# Patient Record
Sex: Male | Born: 1950 | Race: White | Hispanic: No | Marital: Married | Smoking: Never smoker
Health system: Southern US, Community
[De-identification: ages and names within clinical notes are randomized; demographics above are authoritative.]

## PROBLEM LIST (undated history)

## (undated) DIAGNOSIS — E669 Obesity, unspecified: Secondary | ICD-10-CM

## (undated) DIAGNOSIS — T884XXA Failed or difficult intubation, initial encounter: Secondary | ICD-10-CM

## (undated) DIAGNOSIS — I10 Essential (primary) hypertension: Secondary | ICD-10-CM

## (undated) DIAGNOSIS — Z8719 Personal history of other diseases of the digestive system: Secondary | ICD-10-CM

## (undated) DIAGNOSIS — E785 Hyperlipidemia, unspecified: Secondary | ICD-10-CM

## (undated) DIAGNOSIS — K4090 Unilateral inguinal hernia, without obstruction or gangrene, not specified as recurrent: Secondary | ICD-10-CM

## (undated) DIAGNOSIS — E119 Type 2 diabetes mellitus without complications: Secondary | ICD-10-CM

## (undated) DIAGNOSIS — G4730 Sleep apnea, unspecified: Secondary | ICD-10-CM

## (undated) DIAGNOSIS — N529 Male erectile dysfunction, unspecified: Secondary | ICD-10-CM

## (undated) HISTORY — DX: Unilateral inguinal hernia, without obstruction or gangrene, not specified as recurrent: K40.90

## (undated) HISTORY — DX: Essential (primary) hypertension: I10

## (undated) HISTORY — DX: Hyperlipidemia, unspecified: E78.5

## (undated) HISTORY — DX: Sleep apnea, unspecified: G47.30

## (undated) HISTORY — DX: Male erectile dysfunction, unspecified: N52.9

## (undated) HISTORY — DX: Obesity, unspecified: E66.9

## (undated) HISTORY — PX: INGUINAL HERNIA REPAIR: SUR1180

## (undated) HISTORY — DX: Personal history of other diseases of the digestive system: Z87.19

## (undated) HISTORY — PX: COLONOSCOPY: SHX174

## (undated) HISTORY — DX: Type 2 diabetes mellitus without complications: E11.9

---

## 2006-08-08 ENCOUNTER — Ambulatory Visit: Payer: Self-pay | Admitting: Gastroenterology

## 2012-01-16 ENCOUNTER — Ambulatory Visit: Payer: Self-pay | Admitting: Gastroenterology

## 2012-04-14 ENCOUNTER — Emergency Department: Payer: Self-pay | Admitting: Emergency Medicine

## 2012-04-14 LAB — URINALYSIS, COMPLETE
Bacteria: NONE SEEN
Bilirubin,UR: NEGATIVE
Glucose,UR: NEGATIVE mg/dL (ref 0–75)
Leukocyte Esterase: NEGATIVE
RBC,UR: 2 /HPF (ref 0–5)
Specific Gravity: 1.023 (ref 1.003–1.030)
Squamous Epithelial: <1
WBC UR: 2 /HPF (ref 0–5)

## 2012-04-14 LAB — COMPREHENSIVE METABOLIC PANEL
Alkaline Phosphatase: 100 U/L (ref 50–136)
Anion Gap: 7 (ref 7–16)
BUN: 16 mg/dL (ref 7–18)
Calcium, Total: 9.6 mg/dL (ref 8.5–10.1)
Chloride: 101 mmol/L (ref 98–107)
Co2: 28 mmol/L (ref 21–32)
Creatinine: 1.13 mg/dL (ref 0.60–1.30)
EGFR (African American): >60
EGFR (Non-African Amer.): >60
Glucose: 226 mg/dL — ABNORMAL HIGH (ref 65–99)
Potassium: 3.7 mmol/L (ref 3.5–5.1)
SGPT (ALT): 21 U/L (ref 12–78)

## 2012-04-14 LAB — CBC
HCT: 41.3 % (ref 40.0–52.0)
MCV: 88 fL (ref 80–100)
RBC: 4.67 10*6/uL (ref 4.40–5.90)
RDW: 13.7 % (ref 11.5–14.5)
WBC: 9.3 10*3/uL (ref 3.8–10.6)

## 2014-12-19 ENCOUNTER — Other Ambulatory Visit: Payer: Self-pay | Admitting: *Deleted

## 2014-12-19 ENCOUNTER — Encounter: Payer: Self-pay | Admitting: Surgery

## 2014-12-19 ENCOUNTER — Ambulatory Visit
Admission: RE | Admit: 2014-12-19 | Discharge: 2014-12-19 | Disposition: A | Discharge status: Home / Self Care | Payer: BLUE CROSS/BLUE SHIELD | Source: Ambulatory Visit | Attending: Surgery | Admitting: Surgery

## 2014-12-19 ENCOUNTER — Encounter: Payer: Self-pay | Admitting: *Deleted

## 2014-12-19 ENCOUNTER — Ambulatory Visit (INDEPENDENT_AMBULATORY_CARE_PROVIDER_SITE_OTHER): Payer: BLUE CROSS/BLUE SHIELD | Admitting: Surgery

## 2014-12-19 ENCOUNTER — Other Ambulatory Visit
Admission: RE | Admit: 2014-12-19 | Discharge: 2014-12-19 | Disposition: A | Discharge status: Home / Self Care | Payer: BLUE CROSS/BLUE SHIELD | Source: Ambulatory Visit | Attending: Surgery | Admitting: Surgery

## 2014-12-19 VITALS — BP 146/82 | HR 100 | Temp 99.2°F | Ht 70.0 in | Wt 239.0 lb

## 2014-12-19 DIAGNOSIS — R1084 Generalized abdominal pain: Secondary | ICD-10-CM | POA: Diagnosis present

## 2014-12-19 DIAGNOSIS — R1010 Upper abdominal pain, unspecified: Secondary | ICD-10-CM | POA: Diagnosis not present

## 2014-12-19 DIAGNOSIS — K5730 Diverticulosis of large intestine without perforation or abscess without bleeding: Secondary | ICD-10-CM | POA: Diagnosis not present

## 2014-12-19 DIAGNOSIS — E785 Hyperlipidemia, unspecified: Secondary | ICD-10-CM | POA: Insufficient documentation

## 2014-12-19 DIAGNOSIS — R1032 Left lower quadrant pain: Secondary | ICD-10-CM | POA: Diagnosis not present

## 2014-12-19 DIAGNOSIS — I10 Essential (primary) hypertension: Secondary | ICD-10-CM | POA: Diagnosis not present

## 2014-12-19 DIAGNOSIS — G8929 Other chronic pain: Secondary | ICD-10-CM | POA: Diagnosis not present

## 2014-12-19 DIAGNOSIS — K8020 Calculus of gallbladder without cholecystitis without obstruction: Secondary | ICD-10-CM | POA: Diagnosis not present

## 2014-12-19 DIAGNOSIS — E119 Type 2 diabetes mellitus without complications: Secondary | ICD-10-CM | POA: Insufficient documentation

## 2014-12-19 DIAGNOSIS — R1011 Right upper quadrant pain: Secondary | ICD-10-CM

## 2014-12-19 DIAGNOSIS — K8010 Calculus of gallbladder with chronic cholecystitis without obstruction: Secondary | ICD-10-CM

## 2014-12-19 LAB — CBC WITH DIFFERENTIAL/PLATELET
BASOS PCT: 0 %
Basophils Absolute: 0 10*3/uL (ref 0–0.1)
EOS ABS: 0.1 10*3/uL (ref 0–0.7)
EOS PCT: 1 %
HEMATOCRIT: 39.6 % — AB (ref 40.0–52.0)
Hemoglobin: 13.4 g/dL (ref 13.0–18.0)
Lymphocytes Relative: 8 %
Lymphs Abs: 1 10*3/uL (ref 1.0–3.6)
MCH: 29.7 pg (ref 26.0–34.0)
MCHC: 33.9 g/dL (ref 32.0–36.0)
MCV: 87.6 fL (ref 80.0–100.0)
MONO ABS: 1.1 10*3/uL — AB (ref 0.2–1.0)
MONOS PCT: 9 %
Neutro Abs: 9.5 10*3/uL — ABNORMAL HIGH (ref 1.4–6.5)
Neutrophils Relative %: 82 %
PLATELETS: 203 10*3/uL (ref 150–440)
RBC: 4.52 MIL/uL (ref 4.40–5.90)
RDW: 13.4 % (ref 11.5–14.5)
WBC: 11.6 10*3/uL — ABNORMAL HIGH (ref 3.8–10.6)

## 2014-12-19 LAB — COMPREHENSIVE METABOLIC PANEL
ALBUMIN: 3.5 g/dL (ref 3.5–5.0)
ALT: 23 U/L (ref 17–63)
ANION GAP: 11 (ref 5–15)
AST: 19 U/L (ref 15–41)
Alkaline Phosphatase: 83 U/L (ref 38–126)
BILIRUBIN TOTAL: 1.1 mg/dL (ref 0.3–1.2)
BUN: 18 mg/dL (ref 6–20)
CALCIUM: 9.1 mg/dL (ref 8.9–10.3)
CHLORIDE: 99 mmol/L — AB (ref 101–111)
CO2: 27 mmol/L (ref 22–32)
CREATININE: 1.34 mg/dL — AB (ref 0.61–1.24)
GFR calc Af Amer: >60 mL/min (ref >60)
GFR calc non Af Amer: 54 mL/min — ABNORMAL LOW (ref >60)
Glucose, Bld: 145 mg/dL — ABNORMAL HIGH (ref 65–99)
POTASSIUM: 3.8 mmol/L (ref 3.5–5.1)
SODIUM: 137 mmol/L (ref 135–145)
TOTAL PROTEIN: 7.1 g/dL (ref 6.5–8.1)

## 2014-12-19 MED ORDER — SULFAMETHOXAZOLE-TRIMETHOPRIM 400-80 MG PO TABS: 1.0000 | ORAL_TABLET | Freq: Two times a day (BID) | ORAL | Status: DC

## 2014-12-19 MED ORDER — METRONIDAZOLE 500 MG PO TABS: 500.0000 mg | ORAL_TABLET | Freq: Three times a day (TID) | ORAL | Status: DC

## 2014-12-19 MED ORDER — IOHEXOL 300 MG/ML  SOLN
100.0000 mL | Freq: Once | INTRAMUSCULAR | Status: AC | PRN
  Administered 2014-12-19: 100 mL via INTRAVENOUS

## 2014-12-19 NOTE — Progress Notes (Signed)
Surgical Consultation  12/19/2014  Alex Branch is an 64 y.o. male.   CC: Epigastric and right upper quadrant pain  HPI: This patient with known diabetes and gallstones who was seen by Dr. Michela PitcherEly several years ago and cholecystectomy was suggested but the patient did not want to have it done. Over the last week he's had recurrent epigastric right upper quadrant pain associated fatty food intolerance is also had some dark urine but no acholic stools. He has also had fevers and night sweats and chills and had a low-grade temperature in the office today.  Past Medical History  Diagnosis Date  . Sleep apnea   . Obesity   . Hypertension   . Hyperlipidemia   . History of IBS   . Erectile dysfunction   . Inguinal hernia   . Diabetes mellitus without complication [facility](HCC)     Past Surgical History  Procedure Laterality Date  . Inguinal hernia repair Right   . Colonoscopy      Family History  Problem Relation Age of Onset  . Lung cancer Father   . Diabetes type II Maternal Grandmother     Social History:  reports that he has never smoked. He has never used smokeless tobacco. He reports that he does not drink alcohol or use illicit drugs.  Allergies:  Allergies  Allergen Reactions  . Penicillin V Potassium Rash    Medications reviewed.   Review of Systems:   Review of Systems  Constitutional: Positive for fever, chills and diaphoresis. Negative for weight loss.  HENT: Negative.   Eyes: Negative.   Respiratory: Negative.   Cardiovascular: Negative.   Gastrointestinal: Positive for nausea and abdominal pain. Negative for heartburn, vomiting, diarrhea, constipation, blood in stool and melena.       Has right upper quadrant epigastric pain as well as left lower quadrant pain  Genitourinary: Negative for dysuria, urgency and frequency.  Musculoskeletal: Negative.   Skin: Negative.   Neurological: Negative.  Negative for weakness.  Endo/Heme/Allergies: Negative.    Psychiatric/Behavioral: Negative.      Physical Exam:  BP 146/82 mmHg  Pulse 100  Temp(Src) 99.2 F (37.3 C) (Oral)  Ht 5\' 10"  (1.778 m)  Wt 239 lb (108.41 kg)  BMI 34.29 kg/m2  Physical Exam  Constitutional: He is oriented to person, place, and time and well-developed, well-nourished, and in no distress. No distress.  HENT:  Head: Normocephalic and atraumatic.  Eyes: Pupils are equal, round, and reactive to light. No scleral icterus.  Neck: Normal range of motion. Neck supple.  Cardiovascular: Normal rate, regular rhythm and normal heart sounds.   Pulmonary/Chest: Effort normal and breath sounds normal. No respiratory distress. He has no wheezes. He has no rales.  Abdominal: Soft. He exhibits no distension. There is tenderness. There is no rebound and no guarding.  Minimal right upper quadrant tenderness without a Murphy's sign. Considerable tenderness in the left lower quadrant out peritoneal signs  Musculoskeletal: Normal range of motion. He exhibits no edema.  Lymphadenopathy:    He has no cervical adenopathy.  Neurological: He is alert and oriented to person, place, and time.  Skin: Skin is warm and dry.  Psychiatric: Mood, affect and judgment normal.      No results found for this or any previous visit (from the past 48 hour(s)). No results found.  Assessment/Plan:  This patient with known gallstones who had surgery recommended several years ago he's had no attacks between that time and now the last week he started  having some acid right upper quadrant pain he's also had some fevers chills night sweats and now has some left lower quadrant pain and tenderness. My concern is that this may be simple gallstone disease however he has some urinary changes as well suggestive of possible choledocholithiasis with out obvious icterus or jaundice. We will check labs today but I would also like to proceed with a CT scan of his abdomen and pelvis to ensure that this is not  diverticulitis he has significant left lower quadrant pain and tenderness and this may be something other than simple gallbladder disease I will see him back after the CT scan is performed  Lattie Haw, MD, FACS

## 2014-12-19 NOTE — Patient Instructions (Signed)
Go to the lab and have a CBC and CMP drawn today. Then [facility]  for a stat CT scan at radiology. Follow up in our office in the AM to review results.

## 2014-12-20 ENCOUNTER — Ambulatory Visit (INDEPENDENT_AMBULATORY_CARE_PROVIDER_SITE_OTHER): Payer: BLUE CROSS/BLUE SHIELD | Admitting: Surgery

## 2014-12-20 ENCOUNTER — Encounter: Payer: Self-pay | Admitting: Surgery

## 2014-12-20 VITALS — BP 143/74 | HR 89 | Temp 98.4°F

## 2014-12-20 DIAGNOSIS — R1032 Left lower quadrant pain: Secondary | ICD-10-CM

## 2014-12-20 DIAGNOSIS — K8010 Calculus of gallbladder with chronic cholecystitis without obstruction: Secondary | ICD-10-CM | POA: Diagnosis not present

## 2014-12-20 NOTE — Progress Notes (Signed)
Outpatient Surgical Follow Up  12/20/2014  Alex Branch is an 64 y.o. male.   CC: Abdominal pain  HPI: Patient was here yesterday for abdominal pain both in the right upper quadrant and the left lower quadrant. He states he feels better now he has not had a fever since yesterday and has started his antibiotics were given to him yesterday. Nausea or vomiting and actually ate a regular diet last night and felt well afterwards he has no abdominal pain today. His urine is not is dark as it was yesterday and he has had a normal bowel movement.  Past Medical History  Diagnosis Date  . Sleep apnea   . Obesity   . Hypertension   . Hyperlipidemia   . History of IBS   . Erectile dysfunction   . Inguinal hernia   . Diabetes mellitus without complication [facility])     Past Surgical History  Procedure Laterality Date  . Inguinal hernia repair Right   . Colonoscopy      Family History  Problem Relation Age of Onset  . Lung cancer Father   . Diabetes type II Maternal Grandmother     Social History:  reports that he has never smoked. He has never used smokeless tobacco. He reports that he does not drink alcohol or use illicit drugs.  Allergies:  Allergies  Allergen Reactions  . Penicillin V Potassium Rash    Medications reviewed.   Review of Systems:   Review of Systems  Constitutional: Negative for fever, chills and diaphoresis.  HENT: Negative.   Eyes: Negative.   Respiratory: Negative.   Cardiovascular: Negative.   Gastrointestinal: Positive for abdominal pain. Negative for nausea, vomiting, diarrhea, constipation and blood in stool.  Genitourinary: Negative.   Musculoskeletal: Negative.   Skin: Negative.   Neurological: Negative.   Endo/Heme/Allergies: Negative.   Psychiatric/Behavioral: Negative.      Physical Exam:  BP 143/74 mmHg  Pulse 89  Temp(Src) 98.4 F (36.9 C) (Oral)  Physical Exam  Constitutional: He is oriented to person, place, and time and  well-developed, well-nourished, and in no distress. No distress.  HENT:  Head: Normocephalic and atraumatic.  Eyes: Pupils are equal, round, and reactive to light. No scleral icterus.  Neck: Normal range of motion.  Cardiovascular: Normal rate and regular rhythm.   Pulmonary/Chest: Effort normal. No respiratory distress.  Abdominal: Soft. He exhibits no distension. There is tenderness. There is no rebound and no guarding.  Minimal and improved right upper quadrant and left lower quadrant tenderness without peritoneal signs or Murphy sign  Musculoskeletal: Normal range of motion. He exhibits no edema.  Lymphadenopathy:    He has no cervical adenopathy.  Neurological: He is alert and oriented to person, place, and time.  Skin: Skin is warm and dry.  Psychiatric: Mood, affect and judgment normal.      Results for orders placed or performed during the hospital encounter of 12/19/14 (from the past 48 hour(s))  CBC with Differential/Platelet     Status: Abnormal   Collection Time: 12/19/14 12:08 PM  Result Value Ref Range   WBC 11.6 (H) 3.8 - 10.6 K/uL   RBC 4.52 4.40 - 5.90 MIL/uL   Hemoglobin 13.4 13.0 - 18.0 g/dL   HCT 39.6 (L) 40.0 - 52.0 %   MCV 87.6 80.0 - 100.0 fL   MCH 29.7 26.0 - 34.0 pg   MCHC 33.9 32.0 - 36.0 g/dL   RDW 13.4 11.5 - 14.5 %   Platelets 203 150 -  440 K/uL   Neutrophils Relative % 82 %   Neutro Abs 9.5 (H) 1.4 - 6.5 K/uL   Lymphocytes Relative 8 %   Lymphs Abs 1.0 1.0 - 3.6 K/uL   Monocytes Relative 9 %   Monocytes Absolute 1.1 (H) 0.2 - 1.0 K/uL   Eosinophils Relative 1 %   Eosinophils Absolute 0.1 0 - 0.7 K/uL   Basophils Relative 0 %   Basophils Absolute 0.0 0 - 0.1 K/uL  Comprehensive metabolic panel     Status: Abnormal   Collection Time: 12/19/14 12:08 PM  Result Value Ref Range   Sodium 137 135 - 145 mmol/L   Potassium 3.8 3.5 - 5.1 mmol/L   Chloride 99 (L) 101 - 111 mmol/L   CO2 27 22 - 32 mmol/L   Glucose, Bld 145 (H) 65 - 99 mg/dL   BUN 18  6 - 20 mg/dL   Creatinine, Ser 1.34 (H) 0.61 - 1.24 mg/dL   Calcium 9.1 8.9 - 10.3 mg/dL   Total Protein 7.1 6.5 - 8.1 g/dL   Albumin 3.5 3.5 - 5.0 g/dL   AST 19 15 - 41 U/L   ALT 23 17 - 63 U/L   Alkaline Phosphatase 83 38 - 126 U/L   Total Bilirubin 1.1 0.3 - 1.2 mg/dL   GFR calc non Af Amer 54 (L) >60 mL/min   GFR calc Af Amer >60 >60 mL/min    Comment: (NOTE) The eGFR has been calculated using the CKD EPI equation. This calculation has not been validated in all clinical situations. eGFR's persistently <60 mL/min signify possible Chronic Kidney Disease.    Anion gap 11 5 - 15   Ct Abdomen Pelvis W Contrast  12/19/2014  CLINICAL DATA:  RIGHT upper quadrant mid abdominal pain for several months, worsening, patient states "his gallbladder is acting up causing pain". History hypertension, hyperlipidemia, irritable bowel syndrome, diabetes mellitus EXAM: CT ABDOMEN AND PELVIS WITH CONTRAST TECHNIQUE: Multidetector CT imaging of the abdomen and pelvis was performed using the standard protocol following bolus administration of intravenous contrast. Sagittal and coronal MPR images reconstructed from axial data set. CONTRAST:  149m OMNIPAQUE IOHEXOL 300 MG/ML SOLN IV. Dilute oral contrast. COMPARISON:  None ; correlation limited abdominal ultrasound 04/14/2012 FINDINGS: Lung bases clear. Calcified gallstones dependently in gallbladder with mild gallbladder wall thickening. Minimal pericholecystic inflammatory changes. No biliary dilatation. Findings concerning for acute cholecystitis. Liver, spleen, pancreas, kidneys, and adrenal glands normal. Normal appearing ureters and decompressed bladder. Minimal distal colonic diverticulosis without evidence of diverticulitis. Stomach and bowel loops otherwise normal appearance. No mass, adenopathy, free air or free fluid. Appendix not visualized. No acute osseous findings. IMPRESSION: Cholelithiasis with mild gallbladder wall thickening and minimal  pericholecystic infiltration suspicious for acute cholecystitis. Minimal distal colonic diverticulosis. These results will be called to the ordering clinician or representative by the Radiologist Assistant, and communication documented in the PACS or zVision Dashboard. Electronically Signed   By: MLavonia DanaM.D.   On: 12/19/2014 15:06    Assessment/Plan:  CT scan is personally reviewed as are his labs. He has a mild leukocytosis. He also has some diverticulitis low in the pelvis. And the CT also is suggestive of pericolic cystic fluid and thickened gallbladder wall. With his diverticulitis as a comorbidity I have suggested continuing treatment with the oral antibiotics for now area he has improved since yesterday and since starting his antibiotics. I would then plan laparoscopic cholecystectomy with cholangiography in approximately 2 weeks. The options of this were  discussed the rationale for offering this approach was discussed and the risk of bleeding infection recurrence of symptoms failure to resolve his symptoms (procedure bile duct damage bile duct leak retained common bile duct stone any of which could require further surgery and/or ERCP stent and papillotomy were all reviewed with him and understood and agreed to proceed.  I also discussed with him the need to go to the emergency room should he have an attack at any time during the weekend that was unrelenting. Questions were answered for him he understood and agreed to proceed  Florene Glen, MD, FACS

## 2014-12-20 NOTE — Patient Instructions (Signed)
You will be receiving a phone call from our surgery scheduler detailing your pre op appointment date and time, and your surgery date and time in the next few days. If you have any questions please give our office a call at your earliest convenience.

## 2014-12-23 ENCOUNTER — Other Ambulatory Visit: Payer: BLUE CROSS/BLUE SHIELD

## 2014-12-23 ENCOUNTER — Encounter
Admission: RE | Admit: 2014-12-23 | Discharge: 2014-12-23 | Disposition: A | Discharge status: Home / Self Care | Payer: BLUE CROSS/BLUE SHIELD | Source: Ambulatory Visit | Attending: Surgery | Admitting: Surgery

## 2014-12-23 DIAGNOSIS — K8050 Calculus of bile duct without cholangitis or cholecystitis without obstruction: Secondary | ICD-10-CM | POA: Diagnosis not present

## 2014-12-23 DIAGNOSIS — Z01818 Encounter for other preprocedural examination: Secondary | ICD-10-CM | POA: Diagnosis not present

## 2014-12-23 NOTE — Patient Instructions (Signed)
  Your procedure is scheduled on: Wednesday 01/04/15 Report to Day Surgery. 2ND FLOOR MEDICAL MALL ENTRANCE To find out your arrival time please call [phone] between 1PM - 3PM on Tuesday 01/03/15.  Remember: Instructions that are not followed completely may result in serious medical risk, up to and including death, or upon the discretion of your surgeon and anesthesiologist your surgery may need to be rescheduled.    __X__ 1. Do not eat food or drink liquids after midnight. No gum chewing or hard candies.     __X__ 2. No Alcohol for 24 hours before or after surgery.   ____ 3. Bring all medications with you on the day of surgery if instructed.    __X__ 4. Notify your doctor if there is any change in your medical condition     (cold, fever, infections).     Do not wear jewelry, make-up, hairpins, clips or nail polish.  Do not wear lotions, powders, or perfumes.  Do not shave 48 hours prior to surgery. Men may shave face and neck.  Do not bring valuables to the hospital.    [facility] not [facility] any belongings or valuables.               Contacts, dentures or bridgework may not be worn into surgery.  Leave your suitcase in the car. After surgery it may be brought to your room.  For patients admitted to the hospital, discharge time is determined by your                treatment team.   Patients discharged the day of surgery will not be allowed to drive home.   Please read over the following fact sheets that you were given:   Surgical Site Infection Prevention   __X__ Take these medicines the morning of surgery with A SIP OF WATER:    1. NONE (you take your blood pressure med and cholesterol med in the evening as usual)  2.   3.   4.  5.  6.  ____ Fleet Enema (as directed)   __X__ Use CHG Soap as directed  ____ Use inhalers on the day of surgery  __X__ Stop metformin 2 days prior to surgery    __X__ Take 1/2 of usual insulin dose the night before surgery  and NONE on the morning of surgery.   __X__ Stop Coumadin/Plavix/aspirin on 7 DAYS PRIOR TO YOUR PROCEDURE  ____ Stop Anti-inflammatories on     __X__ Stop supplements until after surgery.  B12  ____ Bring C-Pap to the hospital.

## 2014-12-27 ENCOUNTER — Telehealth: Payer: Self-pay | Admitting: Surgery

## 2014-12-27 NOTE — Telephone Encounter (Signed)
Pt advised of pre op date/time and sx date. Sx: 01/04/15 with Dr Ludwig Clarksooper--Laparoscopic cholecystectomy with gram. Pre op: 12/23/14 at 11:00am--office.

## 2014-12-29 ENCOUNTER — Other Ambulatory Visit: Payer: BLUE CROSS/BLUE SHIELD

## 2015-01-04 ENCOUNTER — Ambulatory Visit: Payer: BLUE CROSS/BLUE SHIELD | Admitting: Anesthesiology

## 2015-01-04 ENCOUNTER — Observation Stay
Admission: RE | Admit: 2015-01-04 | Discharge: 2015-01-06 | Disposition: A | Discharge status: Home / Self Care | Payer: BLUE CROSS/BLUE SHIELD | Source: Ambulatory Visit | Attending: Surgery | Admitting: Surgery

## 2015-01-04 ENCOUNTER — Encounter
Admission: RE | Disposition: A | Discharge status: Home / Self Care | Payer: Self-pay | Source: Ambulatory Visit | Attending: Surgery

## 2015-01-04 DIAGNOSIS — Z79899 Other long term (current) drug therapy: Secondary | ICD-10-CM | POA: Insufficient documentation

## 2015-01-04 DIAGNOSIS — Z7982 Long term (current) use of aspirin: Secondary | ICD-10-CM | POA: Diagnosis not present

## 2015-01-04 DIAGNOSIS — E785 Hyperlipidemia, unspecified: Secondary | ICD-10-CM | POA: Diagnosis not present

## 2015-01-04 DIAGNOSIS — Z7984 Long term (current) use of oral hypoglycemic drugs: Secondary | ICD-10-CM | POA: Insufficient documentation

## 2015-01-04 DIAGNOSIS — Z880 Allergy status to penicillin: Secondary | ICD-10-CM | POA: Insufficient documentation

## 2015-01-04 DIAGNOSIS — K812 Acute cholecystitis with chronic cholecystitis: Secondary | ICD-10-CM | POA: Diagnosis present

## 2015-01-04 DIAGNOSIS — K8000 Calculus of gallbladder with acute cholecystitis without obstruction: Principal | ICD-10-CM | POA: Insufficient documentation

## 2015-01-04 DIAGNOSIS — I10 Essential (primary) hypertension: Secondary | ICD-10-CM | POA: Diagnosis not present

## 2015-01-04 DIAGNOSIS — Z794 Long term (current) use of insulin: Secondary | ICD-10-CM | POA: Diagnosis not present

## 2015-01-04 DIAGNOSIS — Z833 Family history of diabetes mellitus: Secondary | ICD-10-CM | POA: Insufficient documentation

## 2015-01-04 DIAGNOSIS — E669 Obesity, unspecified: Secondary | ICD-10-CM | POA: Diagnosis not present

## 2015-01-04 DIAGNOSIS — E119 Type 2 diabetes mellitus without complications: Secondary | ICD-10-CM | POA: Diagnosis not present

## 2015-01-04 DIAGNOSIS — K8010 Calculus of gallbladder with chronic cholecystitis without obstruction: Secondary | ICD-10-CM | POA: Diagnosis present

## 2015-01-04 DIAGNOSIS — K589 Irritable bowel syndrome without diarrhea: Secondary | ICD-10-CM | POA: Diagnosis not present

## 2015-01-04 DIAGNOSIS — Z801 Family history of malignant neoplasm of trachea, bronchus and lung: Secondary | ICD-10-CM | POA: Diagnosis not present

## 2015-01-04 DIAGNOSIS — G4730 Sleep apnea, unspecified: Secondary | ICD-10-CM | POA: Diagnosis not present

## 2015-01-04 HISTORY — DX: Failed or difficult intubation, initial encounter: T88.4XXA

## 2015-01-04 HISTORY — PX: CHOLECYSTECTOMY: SHX55

## 2015-01-04 LAB — CREATININE, SERUM
Creatinine, Ser: 0.98 mg/dL (ref 0.61–1.24)
GFR calc non Af Amer: >60 mL/min (ref >60)

## 2015-01-04 LAB — CBC
HEMATOCRIT: 39.7 % — AB (ref 40.0–52.0)
Hemoglobin: 13.2 g/dL (ref 13.0–18.0)
MCH: 29.4 pg (ref 26.0–34.0)
MCHC: 33.2 g/dL (ref 32.0–36.0)
MCV: 88.5 fL (ref 80.0–100.0)
PLATELETS: 210 10*3/uL (ref 150–440)
RBC: 4.49 MIL/uL (ref 4.40–5.90)
RDW: 13.8 % (ref 11.5–14.5)
WBC: 14.4 10*3/uL — ABNORMAL HIGH (ref 3.8–10.6)

## 2015-01-04 LAB — GLUCOSE, CAPILLARY
GLUCOSE-CAPILLARY: 242 mg/dL — AB (ref 65–99)
GLUCOSE-CAPILLARY: 216 mg/dL — AB (ref 65–99)
GLUCOSE-CAPILLARY: 270 mg/dL — AB (ref 65–99)
Glucose-Capillary: 208 mg/dL — ABNORMAL HIGH (ref 65–99)
Glucose-Capillary: 244 mg/dL — ABNORMAL HIGH (ref 65–99)

## 2015-01-04 SURGERY — LAPAROSCOPIC CHOLECYSTECTOMY WITH INTRAOPERATIVE CHOLANGIOGRAM
Anesthesia: General

## 2015-01-04 MED ORDER — HEPARIN SODIUM (PORCINE) 5000 UNIT/ML IJ SOLN
5000.0000 [IU] | Freq: Three times a day (TID) | INTRAMUSCULAR | Status: DC
  Administered 2015-01-04 – 2015-01-06 (×5): 5000 [IU] via SUBCUTANEOUS
  Filled 2015-01-04 (×4): qty 1

## 2015-01-04 MED ORDER — INSULIN GLARGINE 100 UNIT/ML ~~LOC~~ SOLN
48.0000 [IU] | Freq: Every day | SUBCUTANEOUS | Status: DC
  Administered 2015-01-05 – 2015-01-06 (×2): 48 [IU] via SUBCUTANEOUS
  Filled 2015-01-04 (×2): qty 0.48

## 2015-01-04 MED ORDER — HEPARIN SODIUM (PORCINE) 5000 UNIT/ML IJ SOLN
INTRAMUSCULAR | Status: AC
  Administered 2015-01-04: 5000 [IU] via SUBCUTANEOUS
  Filled 2015-01-04: qty 1

## 2015-01-04 MED ORDER — FENTANYL CITRATE (PF) 100 MCG/2ML IJ SOLN
INTRAMUSCULAR | Status: AC
  Administered 2015-01-04: 25 ug via INTRAVENOUS
  Filled 2015-01-04: qty 2

## 2015-01-04 MED ORDER — ROCURONIUM BROMIDE 100 MG/10ML IV SOLN
INTRAVENOUS | Status: DC | PRN
  Administered 2015-01-04: 30 mg via INTRAVENOUS
  Administered 2015-01-04: 20 mg via INTRAVENOUS

## 2015-01-04 MED ORDER — CYANOCOBALAMIN 500 MCG PO TABS
1000.0000 ug | ORAL_TABLET | Freq: Every day | ORAL | Status: DC
  Administered 2015-01-05 – 2015-01-06 (×2): 1000 ug via ORAL
  Filled 2015-01-04 (×3): qty 2

## 2015-01-04 MED ORDER — INSULIN ASPART 100 UNIT/ML FLEXPEN: 4.0000 [IU] | PEN_INJECTOR | Freq: Two times a day (BID) | SUBCUTANEOUS | Status: DC

## 2015-01-04 MED ORDER — PROPOFOL 10 MG/ML IV BOLUS
INTRAVENOUS | Status: DC | PRN
  Administered 2015-01-04: 200 mg via INTRAVENOUS

## 2015-01-04 MED ORDER — LIDOCAINE HCL (CARDIAC) 20 MG/ML IV SOLN
INTRAVENOUS | Status: DC | PRN
  Administered 2015-01-04: 100 mg via INTRAVENOUS

## 2015-01-04 MED ORDER — CIPROFLOXACIN IN D5W 400 MG/200ML IV SOLN
INTRAVENOUS | Status: AC
  Administered 2015-01-04: 400 mg via INTRAVENOUS
  Filled 2015-01-04: qty 200

## 2015-01-04 MED ORDER — LISINOPRIL 10 MG PO TABS
10.0000 mg | ORAL_TABLET | Freq: Every day | ORAL | Status: DC
  Administered 2015-01-04: 10 mg via ORAL
  Filled 2015-01-04 (×3): qty 1

## 2015-01-04 MED ORDER — OXYCODONE HCL 5 MG/5ML PO SOLN: 5.0000 mg | Freq: Once | ORAL | Status: DC | PRN

## 2015-01-04 MED ORDER — OXYCODONE HCL 5 MG PO TABS: 5.0000 mg | ORAL_TABLET | Freq: Once | ORAL | Status: DC | PRN

## 2015-01-04 MED ORDER — SIMVASTATIN 40 MG PO TABS
40.0000 mg | ORAL_TABLET | Freq: Every evening | ORAL | Status: DC
  Administered 2015-01-04 – 2015-01-05 (×2): 40 mg via ORAL
  Filled 2015-01-04 (×2): qty 1

## 2015-01-04 MED ORDER — FAMOTIDINE 20 MG PO TABS
20.0000 mg | ORAL_TABLET | Freq: Once | ORAL | Status: AC
  Administered 2015-01-04: 20 mg via ORAL

## 2015-01-04 MED ORDER — BUPIVACAINE-EPINEPHRINE (PF) 0.25% -1:200000 IJ SOLN
INTRAMUSCULAR | Status: DC | PRN
  Administered 2015-01-04: 30 mL via PERINEURAL

## 2015-01-04 MED ORDER — METFORMIN HCL 500 MG PO TABS
500.0000 mg | ORAL_TABLET | Freq: Two times a day (BID) | ORAL | Status: DC
  Administered 2015-01-04 – 2015-01-06 (×4): 500 mg via ORAL
  Filled 2015-01-04 (×4): qty 1

## 2015-01-04 MED ORDER — MORPHINE SULFATE (PF) 2 MG/ML IV SOLN
2.0000 mg | INTRAVENOUS | Status: DC | PRN
  Administered 2015-01-04: 2 mg via INTRAVENOUS
  Filled 2015-01-04: qty 1

## 2015-01-04 MED ORDER — ONDANSETRON HCL 4 MG/2ML IJ SOLN
INTRAMUSCULAR | Status: DC | PRN
  Administered 2015-01-04: 4 mg via INTRAVENOUS

## 2015-01-04 MED ORDER — HEPARIN SODIUM (PORCINE) 5000 UNIT/ML IJ SOLN
5000.0000 [IU] | Freq: Once | INTRAMUSCULAR | Status: AC
  Administered 2015-01-04: 5000 [IU] via SUBCUTANEOUS

## 2015-01-04 MED ORDER — FENTANYL CITRATE (PF) 100 MCG/2ML IJ SOLN
INTRAMUSCULAR | Status: AC
  Administered 2015-01-04: 50 ug via INTRAVENOUS
  Filled 2015-01-04: qty 2

## 2015-01-04 MED ORDER — PIOGLITAZONE HCL 45 MG PO TABS
45.0000 mg | ORAL_TABLET | Freq: Every day | ORAL | Status: DC
  Administered 2015-01-05 – 2015-01-06 (×2): 45 mg via ORAL
  Filled 2015-01-04 (×3): qty 1

## 2015-01-04 MED ORDER — OXYCODONE HCL 5 MG PO TABS
5.0000 mg | ORAL_TABLET | ORAL | Status: DC | PRN
  Administered 2015-01-04 – 2015-01-05 (×4): 5 mg via ORAL
  Filled 2015-01-04 (×4): qty 1

## 2015-01-04 MED ORDER — CIPROFLOXACIN IN D5W 400 MG/200ML IV SOLN
400.0000 mg | INTRAVENOUS | Status: AC
  Administered 2015-01-04 (×2): 400 mg via INTRAVENOUS

## 2015-01-04 MED ORDER — FENTANYL CITRATE (PF) 100 MCG/2ML IJ SOLN
25.0000 ug | INTRAMUSCULAR | Status: DC | PRN
  Administered 2015-01-04: 50 ug via INTRAVENOUS
  Administered 2015-01-04 (×2): 25 ug via INTRAVENOUS
  Administered 2015-01-04: 50 ug via INTRAVENOUS

## 2015-01-04 MED ORDER — ADULT MULTIVITAMIN W/MINERALS CH
1.0000 | ORAL_TABLET | Freq: Every day | ORAL | Status: DC
Start: 2015-01-04 — End: 2015-01-06
  Administered 2015-01-04 – 2015-01-06 (×3): 1 via ORAL
  Filled 2015-01-04 (×3): qty 1

## 2015-01-04 MED ORDER — BUPIVACAINE-EPINEPHRINE (PF) 0.25% -1:200000 IJ SOLN
INTRAMUSCULAR | Status: AC
  Filled 2015-01-04: qty 30

## 2015-01-04 MED ORDER — SUGAMMADEX SODIUM 200 MG/2ML IV SOLN
INTRAVENOUS | Status: DC | PRN
  Administered 2015-01-04: 200 mg via INTRAVENOUS

## 2015-01-04 MED ORDER — INSULIN ASPART 100 UNIT/ML ~~LOC~~ SOLN
4.0000 [IU] | Freq: Two times a day (BID) | SUBCUTANEOUS | Status: DC
  Administered 2015-01-04 – 2015-01-06 (×4): 4 [IU] via SUBCUTANEOUS
  Filled 2015-01-04 (×4): qty 4

## 2015-01-04 MED ORDER — SODIUM CHLORIDE 0.9 % IV SOLN
INTRAVENOUS | Status: DC
  Administered 2015-01-04 (×2): via INTRAVENOUS

## 2015-01-04 MED ORDER — ASPIRIN EC 81 MG PO TBEC
81.0000 mg | DELAYED_RELEASE_TABLET | Freq: Every day | ORAL | Status: DC
  Administered 2015-01-04 – 2015-01-06 (×3): 81 mg via ORAL
  Filled 2015-01-04 (×3): qty 1

## 2015-01-04 MED ORDER — FENTANYL CITRATE (PF) 100 MCG/2ML IJ SOLN
INTRAMUSCULAR | Status: DC | PRN
  Administered 2015-01-04 (×2): 50 ug via INTRAVENOUS

## 2015-01-04 MED ORDER — FAMOTIDINE 20 MG PO TABS
ORAL_TABLET | ORAL | Status: AC
  Administered 2015-01-04: 09:00:00
  Filled 2015-01-04: qty 1

## 2015-01-04 MED ORDER — MIDAZOLAM HCL 2 MG/2ML IJ SOLN
INTRAMUSCULAR | Status: DC | PRN
  Administered 2015-01-04: 2 mg via INTRAVENOUS

## 2015-01-04 SURGICAL SUPPLY — 42 items
ADHESIVE MASTISOL STRL (MISCELLANEOUS) ×3 IMPLANT
APPLIER CLIP ROT 10 11.4 M/L (STAPLE) ×3
BLADE SURG SZ11 CARB STEEL (BLADE) ×3 IMPLANT
CANISTER SUCT 1200ML W/VALVE (MISCELLANEOUS) ×3 IMPLANT
CATH CHOLANGI 4FR 420404F (CATHETERS) ×3 IMPLANT
CHLORAPREP W/TINT 26ML (MISCELLANEOUS) ×3 IMPLANT
CLIP APPLIE ROT 10 11.4 M/L (STAPLE) ×1 IMPLANT
CLOSURE WOUND 1/2 X4 (GAUZE/BANDAGES/DRESSINGS) ×1
CONRAY 60ML FOR OR (MISCELLANEOUS) IMPLANT
DRAPE C-ARM XRAY 36X54 (DRAPES) ×3 IMPLANT
ENDOPOUCH RETRIEVER 10 (MISCELLANEOUS) ×3 IMPLANT
GAUZE SPONGE NON-WVN 2X2 STRL (MISCELLANEOUS) ×4 IMPLANT
GLOVE BIO SURGEON STRL SZ8 (GLOVE) ×3 IMPLANT
GOWN STRL REUS W/ TWL LRG LVL3 (GOWN DISPOSABLE) ×4 IMPLANT
GOWN STRL REUS W/TWL LRG LVL3 (GOWN DISPOSABLE) ×8
IRRIGATION STRYKERFLOW (MISCELLANEOUS) ×1 IMPLANT
IRRIGATOR STRYKERFLOW (MISCELLANEOUS) ×3
IV CATH ANGIO 12GX3 LT BLUE (NEEDLE) ×3 IMPLANT
IV NS 1000ML (IV SOLUTION) ×2
IV NS 1000ML BAXH (IV SOLUTION) ×1 IMPLANT
JACKSON PRATT 10 (INSTRUMENTS) ×3 IMPLANT
KIT RM TURNOVER STRD PROC AR (KITS) ×3 IMPLANT
LABEL OR SOLS (LABEL) IMPLANT
NDL SAFETY 22GX1.5 (NEEDLE) ×3 IMPLANT
NEEDLE VERESS 14GA 120MM (NEEDLE) ×3 IMPLANT
NS IRRIG 500ML POUR BTL (IV SOLUTION) ×3 IMPLANT
PACK LAP CHOLECYSTECTOMY (MISCELLANEOUS) ×3 IMPLANT
PAD GROUND ADULT SPLIT (MISCELLANEOUS) ×3 IMPLANT
SCISSORS METZENBAUM CVD 33 (INSTRUMENTS) ×3 IMPLANT
SLEEVE ENDOPATH XCEL 5M (ENDOMECHANICALS) ×3 IMPLANT
SPONGE EXCIL AMD DRAIN 4X4 6P (MISCELLANEOUS) ×3 IMPLANT
SPONGE LAP 18X18 5 PK (GAUZE/BANDAGES/DRESSINGS) IMPLANT
SPONGE VERSALON 2X2 STRL (MISCELLANEOUS) ×8
STRIP CLOSURE SKIN 1/2X4 (GAUZE/BANDAGES/DRESSINGS) ×2 IMPLANT
SUT MNCRL 4-0 (SUTURE) ×4
SUT MNCRL 4-0 27XMFL (SUTURE) ×2
SUT VICRYL 0 AB UR-6 (SUTURE) ×3 IMPLANT
SUTURE MNCRL 4-0 27XMF (SUTURE) ×2 IMPLANT
SYR 20CC LL (SYRINGE) ×3 IMPLANT
TROCAR XCEL NON-BLD 11X100MML (ENDOMECHANICALS) ×3 IMPLANT
TROCAR XCEL NON-BLD 5MMX100MML (ENDOMECHANICALS) ×6 IMPLANT
TUBING INSUFFLATOR HI FLOW (MISCELLANEOUS) ×3 IMPLANT

## 2015-01-04 NOTE — Transfer of Care (Signed)
Immediate Anesthesia Transfer of Care Note  Patient: Alex GasserRonald C Bowditch  Procedure(s) Performed: Procedure(s): LAPAROSCOPIC CHOLECYSTECTOMY (N/A)  Patient Location: PACU  Anesthesia Type:General  Level of Consciousness: awake, alert , oriented and patient cooperative  Airway & Oxygen Therapy: Patient Spontanous Breathing and Patient connected to face mask oxygen  Post-op Assessment: Report given to RN, Post -op Vital signs reviewed and stable and Patient moving all extremities X 4  Post vital signs: Reviewed and stable  Last Vitals:  Filed Vitals:   01/04/15 0820  BP: 145/76  Pulse: 98  Temp: 37.2 C  Resp: 16    Complications: No apparent anesthesia complications

## 2015-01-04 NOTE — Op Note (Signed)
Laparoscopic Cholecystectomy  Pre-operative Diagnosis: Biliary colic  Post-operative Diagnosis: Acute gangrenous cholecystitis  Procedure: Laparoscopic cholecystectomy  Surgeon: Adah Salvageichard E. Excell Seltzerooper, MD FACS  Anesthesia: Gen. with endotracheal tube  Assistant: PA student  Procedure Details  The patient was seen again in the Holding Room. The benefits, complications, treatment options, and expected outcomes were discussed with the patient. The risks of bleeding, infection, recurrence of symptoms, failure to resolve symptoms, bile duct damage, bile duct leak, retained common bile duct stone, bowel injury, any of which could require further surgery and/or ERCP, stent, or papillotomy were reviewed with the patient. The likelihood of improving the patient's symptoms with return to their baseline status is good.  The patient and/or family concurred with the proposed plan, giving informed consent.  The patient was taken to Operating Room, identified as Alex Branch and the procedure verified as Laparoscopic Cholecystectomy.  A Time Out was held and the above information confirmed.  Prior to the induction of general anesthesia, antibiotic prophylaxis was administered. VTE prophylaxis was in place. General endotracheal anesthesia was then administered and tolerated well. After the induction, the abdomen was prepped with Chloraprep and draped in the sterile fashion. The patient was positioned in the supine position.  Local anesthetic  was injected into the skin near the umbilicus and an incision made. The Veress needle was placed. Pneumoperitoneum was then created with CO2 and tolerated well without any adverse changes in the patient's vital signs. A 5mm port was placed in the periumbilical position and the abdominal cavity was explored.  Two 5-mm ports were placed in the right upper quadrant and a 12 mm epigastric port was placed all under direct vision. All skin incisions  were infiltrated with a local  anesthetic agent before making the incision and placing the trocars.   The patient was positioned  in reverse Trendelenburg, tilted slightly to the patient's left.  The gallbladder was identified, the fundus grasped and retracted cephalad. Adhesions were lysed bluntly. The infundibulum was grasped and retracted laterally, exposing the peritoneum overlying the triangle of Calot. This was then divided and exposed in a blunt fashion. A critical view of the cystic duct and cystic artery was obtained.  The cystic duct was clearly identified and bluntly dissected.   Cystic duct was doubly clipped and divided. The cystic artery was doubly clipped and divided and multiple small lateral branches of venous structures were doubly clipped and divided as well. The gallbladder fossa was extremely densely scarred Fidelity and full dissection of the gallbladder gangrenous material was identified in the gallbladder fossa.  The gallbladder was taken from the gallbladder fossa in a retrograde fashion with the electrocautery. The gallbladder was removed and placed in an Endocatch bag. The liver bed was irrigated and inspected. Hemostasis was achieved with the electrocautery. Copious irrigation was utilized and was repeatedly aspirated until clear.  The gallbladder and Endocatch sac were then removed through the epigastric port site.   Atenolol a JP drain was brought into the abdomen and brought out through the lateral port site and held in with 3-0 nylon. Placed into the foramen of Winslow.  Inspection of the right upper quadrant was performed. No bleeding, bile duct injury or leak, or bowel injury was noted. Pneumoperitoneum was released.  The epigastric port site was closed with figure-of-eight 0 Vicryl sutures. 4-0 subcuticular Monocryl was used to close the skin. Steristrips and Mastisol and sterile dressings were  applied.  The patient was then extubated and brought to the recovery room in  stable condition. Sponge, lap,  and needle counts were correct at closure and at the conclusion of the case.   Findings: Acute gangrenous Cholecystitis   Estimated Blood Loss: 25 cc         Drains: JP         Specimens: Gallbladder           Complications: none               Richard E. Excell Seltzer, MD, FACS

## 2015-01-04 NOTE — Anesthesia Preprocedure Evaluation (Signed)
Anesthesia Evaluation  Patient identified by MRN, date of birth, ID band Patient awake    Reviewed: Allergy & Precautions, H&P , NPO status , Patient's Chart, lab work & pertinent test results  History of Anesthesia Complications Negative for: history of anesthetic complications  Airway Mallampati: III  TM Distance: <3 FB Neck ROM: limited    Dental  (+) Poor Dentition, Chipped   Pulmonary neg shortness of breath, sleep apnea and Continuous Positive Airway Pressure Ventilation ,    Pulmonary exam normal breath sounds clear to auscultation       Cardiovascular Exercise Tolerance: Good hypertension, (-) angina(-) Past MI and (-) DOE Normal cardiovascular exam Rhythm:regular Rate:Normal     Neuro/Psych negative neurological ROS  negative psych ROS   GI/Hepatic negative GI ROS, Neg liver ROS,   Endo/Other  diabetes, Type 2, Insulin Dependent  Renal/GU negative Renal ROS  negative genitourinary   Musculoskeletal   Abdominal   Peds  Hematology negative hematology ROS (+)   Anesthesia Other Findings Past Medical History:   Sleep apnea                                                  Obesity                                                      Hypertension                                                 Hyperlipidemia                                               History of IBS                                               Erectile dysfunction                                         Inguinal hernia                                              Diabetes mellitus without complication (HCC)                Past Surgical History:   INGUINAL HERNIA REPAIR                          Right              COLONOSCOPY  BMI    Body Mass Index   33.00 kg/m 2      Reproductive/Obstetrics negative OB ROS                             Anesthesia  Physical Anesthesia Plan  ASA: III  Anesthesia Plan: General ETT   Post-op Pain Management:    Induction:   Airway Management Planned:   Additional Equipment:   Intra-op Plan:   Post-operative Plan:   Informed Consent: I have reviewed the patients History and Physical, chart, labs and discussed the procedure including the risks, benefits and alternatives for the proposed anesthesia with the patient or authorized representative who has indicated his/her understanding and acceptance.   Dental Advisory Given  Plan Discussed with: Anesthesiologist, CRNA and Surgeon  Anesthesia Plan Comments:         Anesthesia Quick Evaluation

## 2015-01-04 NOTE — Progress Notes (Signed)
Preoperative Review   Patient is met in the preoperative holding area. The history is reviewed in the chart and with the patient. I personally reviewed the options and rationale as well as the risks of this procedure that have been previously discussed with the patient. All questions asked by the patient and/or family were answered to their satisfaction.  Patient agrees to proceed with this procedure at this time.  Javeria Briski E Deshannon Seide M.D. FACS  

## 2015-01-04 NOTE — Progress Notes (Signed)
Pt arrived to room from PACU. Pt was oriented to room and safety plan. VSS. Pain controlled. SCD's on. Drainage noted on dressing on abdomen.

## 2015-01-04 NOTE — Anesthesia Procedure Notes (Signed)
Procedure Name: Intubation Date/Time: 01/04/2015 9:31 AM Performed by: Michaele OfferSAVAGE, Hallel Denherder Pre-anesthesia Checklist: Patient identified, Emergency Drugs available, Suction available, Patient being monitored and Timeout performed Patient Re-evaluated:Patient Re-evaluated prior to inductionOxygen Delivery Method: Circle system utilized Preoxygenation: Pre-oxygenation with 100% oxygen Intubation Type: IV induction Ventilation: Oral airway inserted - appropriate to patient size and Mask ventilation without difficulty Laryngoscope Size: McGraph, 4 and Mac Grade View: Grade II Tube type: Oral Tube size: 7.5 mm Number of attempts: 2 Airway Equipment and Method: Rigid stylet Placement Confirmation: ETT inserted through vocal cords under direct vision,  positive ETCO2 and breath sounds checked- equal and bilateral Secured at: 22 cm Tube secured with: Tape Dental Injury: Teeth and Oropharynx as per pre-operative assessment  Difficulty Due To: Difficult Airway- due to anterior larynx and Difficult Airway- due to limited oral opening Future Recommendations: Recommend- induction with short-acting agent, and alternative techniques readily available Comments: 1st attempt with Mac4 able to see epiglottis, but could not see cords.  Went to [employer] 4 able to see vocal cords inserted tube through and verified.

## 2015-01-04 NOTE — Anesthesia Postprocedure Evaluation (Signed)
  Anesthesia Post-op Note  Patient: Alex Branch  Procedure(s) Performed: Procedure(s): LAPAROSCOPIC CHOLECYSTECTOMY (N/A)  Anesthesia type:General ETT  Patient location: PACU  Post pain: Pain level controlled  Post assessment: Post-op Vital signs reviewed, Patient's Cardiovascular Status Stable, Respiratory Function Stable, Patent Airway and No signs of Nausea or vomiting  Post vital signs: Reviewed and stable  Last Vitals:  Filed Vitals:   01/04/15 1115  BP: 167/80  Pulse: 73  Temp: 37.1 C  Resp: 24    Level of consciousness: awake, alert  and patient cooperative  Complications: No apparent anesthesia complications

## 2015-01-05 DIAGNOSIS — K8000 Calculus of gallbladder with acute cholecystitis without obstruction: Secondary | ICD-10-CM | POA: Diagnosis not present

## 2015-01-05 LAB — COMPREHENSIVE METABOLIC PANEL
ALBUMIN: 3.4 g/dL — AB (ref 3.5–5.0)
ALK PHOS: 51 U/L (ref 38–126)
ALT: 22 U/L (ref 17–63)
ANION GAP: 5 (ref 5–15)
AST: 25 U/L (ref 15–41)
BUN: 8 mg/dL (ref 6–20)
CALCIUM: 8.5 mg/dL — AB (ref 8.9–10.3)
CHLORIDE: 103 mmol/L (ref 101–111)
CO2: 27 mmol/L (ref 22–32)
Creatinine, Ser: 0.91 mg/dL (ref 0.61–1.24)
GFR calc non Af Amer: >60 mL/min (ref >60)
GLUCOSE: 215 mg/dL — AB (ref 65–99)
Potassium: 4.2 mmol/L (ref 3.5–5.1)
SODIUM: 135 mmol/L (ref 135–145)
Total Bilirubin: 0.9 mg/dL (ref 0.3–1.2)
Total Protein: 6.2 g/dL — ABNORMAL LOW (ref 6.5–8.1)

## 2015-01-05 LAB — GLUCOSE, CAPILLARY
GLUCOSE-CAPILLARY: 204 mg/dL — AB (ref 65–99)
GLUCOSE-CAPILLARY: 153 mg/dL — AB (ref 65–99)
Glucose-Capillary: 139 mg/dL — ABNORMAL HIGH (ref 65–99)

## 2015-01-05 LAB — CBC
HEMATOCRIT: 37.9 % — AB (ref 40.0–52.0)
HEMOGLOBIN: 12.9 g/dL — AB (ref 13.0–18.0)
MCH: 30 pg (ref 26.0–34.0)
MCHC: 34 g/dL (ref 32.0–36.0)
MCV: 88.1 fL (ref 80.0–100.0)
Platelets: 197 10*3/uL (ref 150–440)
RBC: 4.3 MIL/uL — ABNORMAL LOW (ref 4.40–5.90)
RDW: 13.7 % (ref 11.5–14.5)
WBC: 9.8 10*3/uL (ref 3.8–10.6)

## 2015-01-05 LAB — SURGICAL PATHOLOGY

## 2015-01-05 NOTE — Progress Notes (Addendum)
Patient A/O, with minimal pain (see EMar). No N/V. JP drain RLQ, incision site above umbilical dry, drainage. Patient continues independent tasks without any complications. No voiding issues. Staff will continue to monitor and meet needs. Patient refused SCD/Heparin, he has been ambulating in the room without any complications. He was educated on the benefits of SCD/Heparin. Patient is hoping to go home today.

## 2015-01-05 NOTE — Progress Notes (Signed)
1 Day Post-Op  Subjective: Patient is status post laparoscopic cholecystectomy for unsuspected acute gangrenous cholecystitis. He has no complaints at this time is tolerating a diet we'll advance his diet to regular today..  Objective: Vital signs in last 24 hours: Temp:  [97.8 F (36.6 C)-98.9 F (37.2 C)] 98.4 F (36.9 C) (11/03 0538) Pulse Rate:  [65-75] 65 (11/03 0538) Resp:  [16-24] 20 (11/03 0538) BP: (121-167)/(61-88) 121/77 mmHg (11/03 0538) SpO2:  [94 %-99 %] 96 % (11/03 0538) Weight:  [246 lb (111.585 kg)] 246 lb (111.585 kg) (11/02 1359) Last BM Date: 01/04/15  Intake/Output from previous day: 11/02 0701 - 11/03 0700 In: 1193 [I.V.:1143] Out: 1815 [Urine:1750; Drains:55; Blood:10] Intake/Output this shift: Total I/O In: 480 [P.O.:480] Out: 0   Physical exam:  Abdomen is soft nondistended nontympanitic and nontender no icterus no jaundice drain is nonbilious.  Lab Results: CBC   Recent Labs  01/04/15 1243 01/05/15 0418  WBC 14.4* 9.8  HGB 13.2 12.9*  HCT 39.7* 37.9*  PLT 210 197   BMET  Recent Labs  01/04/15 1243 01/05/15 0418  NA  --  135  K  --  4.2  CL  --  103  CO2  --  27  GLUCOSE  --  215*  BUN  --  8  CREATININE 0.98 0.91  CALCIUM  --  8.5*   PT/INR No results for input(s): LABPROT, INR in the last 72 hours. ABG No results for input(s): PHART, HCO3 in the last 72 hours.  Invalid input(s): PCO2, PO2  Studies/Results: No results found.  Anti-infectives: Anti-infectives    Start     Dose/Rate Route Frequency Ordered Stop   01/04/15 0817  ciprofloxacin (CIPRO) IVPB 400 mg     400 mg 200 mL/hr over 60 Minutes Intravenous On call to O.R. 01/04/15 0817 01/04/15 0949      Assessment/Plan: s/p Procedure(s): LAPAROSCOPIC CHOLECYSTECTOMY   Patient doing very well recommend advancing diet continue drain until tomorrow when he can be discharged he would benefit from continued IV antibiotics today and then switching to oral  antibiotics tomorrow.Lattie Haw*  Jachelle Fluty E Kosta Schnitzler, MD, FACS  01/05/2015

## 2015-01-06 DIAGNOSIS — K8000 Calculus of gallbladder with acute cholecystitis without obstruction: Secondary | ICD-10-CM | POA: Diagnosis not present

## 2015-01-06 MED ORDER — CIPROFLOXACIN HCL 500 MG PO TABS: 500.0000 mg | ORAL_TABLET | Freq: Two times a day (BID) | ORAL | Status: DC

## 2015-01-06 MED ORDER — OXYCODONE HCL 5 MG PO TABS: 5.0000 mg | ORAL_TABLET | ORAL | Status: DC | PRN

## 2015-01-06 NOTE — Progress Notes (Signed)
Pt A and O x 4. VSS. Pt tolerating diet well. Pt voiding with no difficulties. No complaints of pain or nausea. IV removed intact, prescriptions given. Pt voiced understanding of discharge instructions with no further questions. Pt discharged via wheelchair with axillary.

## 2015-01-06 NOTE — Progress Notes (Signed)
2 Days Post-Op  Subjective: Stop laparoscopic cholecystectomy for acute gangrenous cholecystitis she feels well tolerating a diet. Is to go home  Objective: Vital signs in last 24 hours: Temp:  [98 F (36.7 C)-98.8 F (37.1 C)] 98.2 F (36.8 C) (11/04 0600) Pulse Rate:  [60-75] 60 (11/04 0600) Resp:  [20] 20 (11/04 0600) BP: (121-127)/(71-72) 121/72 mmHg (11/04 0600) SpO2:  [94 %-98 %] 94 % (11/04 0600) Last BM Date: 01/04/15  Intake/Output from previous day: 11/03 0701 - 11/04 0700 In: 720 [P.O.:720] Out: 1560 [Urine:1550; Drains:10] Intake/Output this shift:    Physical exam:  Soft nontender abdomen no bile in drain drain is removed dressing is placed abdomen is otherwise normal.  Lab Results: CBC   Recent Labs  01/04/15 1243 01/05/15 0418  WBC 14.4* 9.8  HGB 13.2 12.9*  HCT 39.7* 37.9*  PLT 210 197   BMET  Recent Labs  01/04/15 1243 01/05/15 0418  NA  --  135  K  --  4.2  CL  --  103  CO2  --  27  GLUCOSE  --  215*  BUN  --  8  CREATININE 0.98 0.91  CALCIUM  --  8.5*   PT/INR No results for input(s): LABPROT, INR in the last 72 hours. ABG No results for input(s): PHART, HCO3 in the last 72 hours.  Invalid input(s): PCO2, PO2  Studies/Results: No results found.  Anti-infectives: Anti-infectives    Start     Dose/Rate Route Frequency Ordered Stop   01/04/15 0817  ciprofloxacin (CIPRO) IVPB 400 mg     400 mg 200 mL/hr over 60 Minutes Intravenous On call to O.R. 01/04/15 0817 01/04/15 0949      Assessment/Plan: s/p Procedure(s): LAPAROSCOPIC CHOLECYSTECTOMY   Labs are reviewed. Drain is been removed. We'll discharge him home today on antibiotics and analgesics. Follow-up in my office.  Lattie Hawichard E Telitha Plath, MD, FACS  01/06/2015

## 2015-01-06 NOTE — Discharge Summary (Signed)
Physician Discharge Summary  Patient ID: Alex Branch MRN: [id number] DOB/AGE: 64/07/1950 64 y.o.  Admit date: 01/04/2015 Discharge date: 01/06/2015   Discharge Diagnoses:  Active Problems:   Acute cholecystitis with chronic cholecystitis   Procedures: Laparoscopic cholecystectomy  Hospital Course: This a patient admitted through the hospital for elective laparoscopic cholecystectomy for presumed biliary colic. At surgery however it was noted that he had acute cholecystitis with gangrenous compounded and purulence. He was admitted the hospital for continued IV antibiotics his drain is been removed with no sign of bile and his liver function tests remain normal he is tolerating a regular diet. We'll discharge today on oral antibiotics and oral analgesics to follow-up in my office in 10 days.  Consults: None  Disposition: Final discharge disposition not confirmed     Medication List    TAKE these medications        aspirin EC 81 MG tablet  Take by mouth daily.     ciprofloxacin 500 MG tablet  Commonly known as:  CIPRO  Take 1 tablet (500 mg total) by mouth 2 (two) times daily.     LANTUS SOLOSTAR 100 UNIT/ML Solostar Pen  Generic drug:  Insulin Glargine  Inject 48 Units into the skin daily with breakfast.     lisinopril 10 MG tablet  Commonly known as:  PRINIVIL,ZESTRIL  Take 10 mg by mouth daily.     metFORMIN 500 MG tablet  Commonly known as:  GLUCOPHAGE  2 (two) times daily with a meal.     NOVOLOG FLEXPEN 100 UNIT/ML FlexPen  Generic drug:  insulin aspart  Inject 4 Units into the skin 2 (two) times daily. Dependant on carb intake     oxyCODONE 5 MG immediate release tablet  Commonly known as:  Oxy IR/ROXICODONE  Take 1 tablet (5 mg total) by mouth every 4 (four) hours as needed for moderate pain.     pioglitazone 45 MG tablet  Commonly known as:  ACTOS  Take 45 mg by mouth daily.     RA VITAMIN B-12 TR 1000 MCG Tbcr  Generic drug:  Cyanocobalamin  Take 1  tablet by mouth daily.     simvastatin 40 MG tablet  Commonly known as:  ZOCOR  Take 40 mg by mouth every evening.           Follow-up Information    Follow up with Dionne Miloichard Erinn Mendosa, MD In 10 days.   Specialty:  Surgery   Why:  For wound re-check   Contact information:   [address] [place] [id number] [phone]       Lattie Hawichard E Brizeida Mcmurry, MD, FACS

## 2015-01-06 NOTE — Discharge Instructions (Signed)
Remove dressing in 24 hours. °May shower in 24 hours. °Leave paper strips in place. °Resume all home medications. °Follow-up with Dr. Hershall Benkert in 10 days. °

## 2015-01-09 ENCOUNTER — Telehealth: Payer: Self-pay

## 2015-01-09 NOTE — Telephone Encounter (Signed)
Post-discharge phone call made to patient at this time. Patient states that he is feeling much better, pain is controlled with just Ibuprofen at this time. Bowels are moving normally. Denies any problems at this time. Answered all questions to patient's satisfaction. Reviewed post-op appointment information with patient, confirms that he will be at appointment. Patient encouraged to call with any questions or concerns.

## 2015-01-13 ENCOUNTER — Encounter: Payer: Self-pay | Admitting: Surgery

## 2015-01-13 ENCOUNTER — Ambulatory Visit (INDEPENDENT_AMBULATORY_CARE_PROVIDER_SITE_OTHER): Payer: BLUE CROSS/BLUE SHIELD | Admitting: Surgery

## 2015-01-13 VITALS — BP 142/79 | HR 76 | Temp 98.3°F | Ht 70.0 in | Wt 235.0 lb

## 2015-01-13 DIAGNOSIS — K8000 Calculus of gallbladder with acute cholecystitis without obstruction: Secondary | ICD-10-CM

## 2015-01-13 NOTE — Progress Notes (Signed)
Outpatient postop visit  01/13/2015  Alex Branch is an 64 y.o. male.    Procedure: Laparoscopic cholecystectomy  CC: Minimal pain  HPI: This a patient who had a laparoscopic cholecystectomy on an elective basis for what was believed to be biliary colic. At the time however acute cholecystitis with gangrene was identified. He was kept in the hospital on IV antibiotics for a short time. He has no complaints and feels well at this time. Medications reviewed.    Physical Exam:  BP 142/79 mmHg  Pulse 76  Temp(Src) 98.3 F (36.8 C) (Oral)  Ht 5\' 10"  (1.778 m)  Wt 235 lb (106.595 kg)  BMI 33.72 kg/m2    PE: Soft nontender abdomen no scleral icterus no jaundice wounds are clean without erythema or drainage    Assessment/Plan:  Acute gangrenous cholecystitis. Doing well. Follow up on an as-needed basis.  Lattie Hawichard E Shloma Roggenkamp, MD, FACS

## 2015-01-13 NOTE — Patient Instructions (Signed)
Please give us a call if you have any questions or concerns. 

## 2016-03-15 NOTE — Discharge Instructions (Signed)
Cataract Surgery, Care After °Refer to this sheet in the next few weeks. These instructions provide you with information about caring for yourself after your procedure. Your health care provider may also give you more specific instructions. Your treatment has been planned according to current medical practices, but problems sometimes occur. Call your health care provider if you have any problems or questions after your procedure. °What can I expect after the procedure? °After the procedure, it is common to have: °· Itching. °· Discomfort. °· Fluid discharge. °· Sensitivity to light and to touch. °· Bruising. °Follow these instructions at home: °Eye Care  °· Check your eye every day for signs of infection. Watch for: °¨ Redness, swelling, or pain. °¨ Fluid, blood, or pus. °¨ Warmth. °¨ Bad smell. °Activity  °· Avoid strenuous activities, such as playing contact sports, for as long as told by your health care provider. °· Do not drive or operate heavy machinery until your health care provider approves. °· Do not bend or lift heavy objects . Bending increases pressure in the eye. You can walk, climb stairs, and do light household chores. °· Ask your health care provider when you can return to work. If you work in a dusty environment, you may be advised to wear protective eyewear for a period of time. °General instructions  °· Take or apply over-the-counter and prescription medicines only as told by your health care provider. This includes eye drops. °· Do not touch or rub your eyes. °· If you were given a protective shield, wear it as told by your health care provider. If you were not given a protective shield, wear sunglasses as told by your health care provider to protect your eyes. °· Keep the area around your eye clean and dry. Avoid swimming or allowing water to hit you directly in the face while showering until told by your health care provider. Keep soap and shampoo out of your eyes. °· Do not put a contact lens  into the affected eye or eyes until your health care provider approves. °· Keep all follow-up visits as told by your health care provider. This is important. °Contact a health care provider if: ° °· You have increased bruising around your eye. °· You have pain that is not helped with medicine. °· You have a fever. °· You have redness, swelling, or pain in your eye. °· You have fluid, blood, or pus coming from your incision. °· Your vision gets worse. °Get help right away if: °· You have sudden vision loss. °This information is not intended to replace advice given to you by your health care provider. Make sure you discuss any questions you have with your health care provider. °Document Released: 09/07/2004 Document Revised: 06/29/2015 Document Reviewed: 12/29/2014 °Elsevier Interactive Patient Education © 2017 [employer] ° ° ° ° °General Anesthesia, Adult, Care After °These instructions provide you with information about caring for yourself after your procedure. Your health care provider may also give you more specific instructions. Your treatment has been planned according to current medical practices, but problems sometimes occur. Call your health care provider if you have any problems or questions after your procedure. °What can I expect after the procedure? °After the procedure, it is common to have: °· Vomiting. °· A sore throat. °· Mental slowness. °It is common to feel: °· Nauseous. °· Cold or shivery. °· Sleepy. °· Tired. °· Sore or achy, even in parts of your body where you did not have surgery. °Follow these instructions at   home: °For at least 24 hours after the procedure:  °· Do not: °¨ Participate in activities where you could fall or become injured. °¨ Drive. °¨ Use heavy machinery. °¨ Drink alcohol. °¨ Take sleeping pills or medicines that cause drowsiness. °¨ Make important decisions or sign legal documents. °¨ Take care of children on your own. °· Rest. °Eating and drinking  °· If you vomit, drink  water, juice, or soup when you can drink without vomiting. °· Drink enough fluid to keep your urine clear or pale yellow. °· Make sure you have little or no nausea before eating solid foods. °· Follow the diet recommended by your health care provider. °General instructions  °· Have a responsible adult stay with you until you are awake and alert. °· Return to your normal activities as told by your health care provider. Ask your health care provider what activities are safe for you. °· Take over-the-counter and prescription medicines only as told by your health care provider. °· If you smoke, do not smoke without supervision. °· Keep all follow-up visits as told by your health care provider. This is important. °Contact a health care provider if: °· You continue to have nausea or vomiting at home, and medicines are not helpful. °· You cannot drink fluids or start eating again. °· You cannot urinate after 8-12 hours. °· You develop a skin rash. °· You have fever. °· You have increasing redness at the site of your procedure. °Get help right away if: °· You have difficulty breathing. °· You have chest pain. °· You have unexpected bleeding. °· You feel that you are having a life-threatening or urgent problem. °This information is not intended to replace advice given to you by your health care provider. Make sure you discuss any questions you have with your health care provider. °Document Released: 05/27/2000 Document Revised: 07/24/2015 Document Reviewed: 02/02/2015 °Elsevier Interactive Patient Education © 2017 [employer] ° °

## 2016-03-18 ENCOUNTER — Encounter
Admission: RE | Disposition: A | Discharge status: Home / Self Care | Payer: Self-pay | Source: Ambulatory Visit | Attending: Ophthalmology

## 2016-03-18 ENCOUNTER — Ambulatory Visit: Payer: Medicare Other | Admitting: Anesthesiology

## 2016-03-18 ENCOUNTER — Ambulatory Visit
Admission: RE | Admit: 2016-03-18 | Discharge: 2016-03-18 | Disposition: A | Discharge status: Home / Self Care | Payer: Medicare Other | Source: Ambulatory Visit | Attending: Ophthalmology | Admitting: Ophthalmology

## 2016-03-18 DIAGNOSIS — Z6835 Body mass index (BMI) 35.0-35.9, adult: Secondary | ICD-10-CM | POA: Insufficient documentation

## 2016-03-18 DIAGNOSIS — H269 Unspecified cataract: Secondary | ICD-10-CM | POA: Diagnosis present

## 2016-03-18 DIAGNOSIS — Z7982 Long term (current) use of aspirin: Secondary | ICD-10-CM | POA: Diagnosis not present

## 2016-03-18 DIAGNOSIS — Z79899 Other long term (current) drug therapy: Secondary | ICD-10-CM | POA: Insufficient documentation

## 2016-03-18 DIAGNOSIS — E119 Type 2 diabetes mellitus without complications: Secondary | ICD-10-CM | POA: Insufficient documentation

## 2016-03-18 DIAGNOSIS — G4730 Sleep apnea, unspecified: Secondary | ICD-10-CM | POA: Insufficient documentation

## 2016-03-18 DIAGNOSIS — I10 Essential (primary) hypertension: Secondary | ICD-10-CM | POA: Diagnosis not present

## 2016-03-18 DIAGNOSIS — Z794 Long term (current) use of insulin: Secondary | ICD-10-CM | POA: Diagnosis not present

## 2016-03-18 DIAGNOSIS — E7800 Pure hypercholesterolemia, unspecified: Secondary | ICD-10-CM | POA: Diagnosis not present

## 2016-03-18 HISTORY — PX: CATARACT EXTRACTION W/PHACO: SHX586

## 2016-03-18 LAB — GLUCOSE, CAPILLARY
GLUCOSE-CAPILLARY: 171 mg/dL — AB (ref 65–99)
GLUCOSE-CAPILLARY: 166 mg/dL — AB (ref 65–99)

## 2016-03-18 SURGERY — PHACOEMULSIFICATION, CATARACT, WITH IOL INSERTION
Anesthesia: Monitor Anesthesia Care | Site: Eye | Laterality: Left | Wound class: Clean

## 2016-03-18 MED ORDER — BRIMONIDINE TARTRATE-TIMOLOL 0.2-0.5 % OP SOLN
OPHTHALMIC | Status: DC | PRN
  Administered 2016-03-18: 1 [drp] via OPHTHALMIC

## 2016-03-18 MED ORDER — ARMC OPHTHALMIC DILATING DROPS
1.0000 "application " | OPHTHALMIC | Status: DC | PRN
  Administered 2016-03-18 (×3): 1 via OPHTHALMIC

## 2016-03-18 MED ORDER — LACTATED RINGERS IV SOLN: INTRAVENOUS | Status: DC

## 2016-03-18 MED ORDER — MOXIFLOXACIN HCL 0.5 % OP SOLN
OPHTHALMIC | Status: DC | PRN
  Administered 2016-03-18: .1 mL via OPHTHALMIC

## 2016-03-18 MED ORDER — BALANCED SALT IO SOLN
INTRAOCULAR | Status: DC | PRN
  Administered 2016-03-18: .5 mL via OPHTHALMIC

## 2016-03-18 MED ORDER — NA HYALUR & NA CHOND-NA HYALUR 0.4-0.35 ML IO KIT
PACK | INTRAOCULAR | Status: DC | PRN
  Administered 2016-03-18: 1 mL via INTRAOCULAR

## 2016-03-18 MED ORDER — FENTANYL CITRATE (PF) 100 MCG/2ML IJ SOLN
INTRAMUSCULAR | Status: DC | PRN
  Administered 2016-03-18: 50 ug via INTRAVENOUS

## 2016-03-18 MED ORDER — MIDAZOLAM HCL 2 MG/2ML IJ SOLN
INTRAMUSCULAR | Status: DC | PRN
  Administered 2016-03-18: 2 mg via INTRAVENOUS

## 2016-03-18 MED ORDER — EPINEPHRINE PF 1 MG/ML IJ SOLN
INTRAOCULAR | Status: DC | PRN
  Administered 2016-03-18: 97 mL via OPHTHALMIC

## 2016-03-18 MED ORDER — MOXIFLOXACIN HCL 0.5 % OP SOLN
1.0000 [drp] | OPHTHALMIC | Status: DC | PRN
Start: 2016-03-18 — End: 2016-03-18
  Administered 2016-03-18 (×3): 1 [drp] via OPHTHALMIC

## 2016-03-18 SURGICAL SUPPLY — 22 items
APPLICATOR COTTON TIP 3IN (MISCELLANEOUS) ×3 IMPLANT
CANNULA ANT/CHMB 27GA (MISCELLANEOUS) ×3 IMPLANT
DISSECTOR HYDRO NUCLEUS 50X22 (MISCELLANEOUS) ×3 IMPLANT
GLOVE BIO SURGEON STRL SZ7 (GLOVE) ×6 IMPLANT
GLOVE SURG LX 6.5 MICRO (GLOVE) ×2
GLOVE SURG LX STRL 6.5 MICRO (GLOVE) ×1 IMPLANT
GOWN STRL REUS W/ TWL LRG LVL3 (GOWN DISPOSABLE) ×2 IMPLANT
GOWN STRL REUS W/TWL LRG LVL3 (GOWN DISPOSABLE) ×4
LENS IOL ACRYSOF IQ 20.0 (Intraocular Lens) ×3 IMPLANT
MARKER SKIN DUAL TIP RULER LAB (MISCELLANEOUS) ×3 IMPLANT
PACK CATARACT BRASINGTON (MISCELLANEOUS) ×3 IMPLANT
PACK EYE AFTER SURG (MISCELLANEOUS) ×3 IMPLANT
PACK OPTHALMIC (MISCELLANEOUS) ×3 IMPLANT
RING MALYGIN 7.0 (MISCELLANEOUS) IMPLANT
SUT ETHILON 10-0 CS-B-6CS-B-6 (SUTURE)
SUT VICRYL  9 0 (SUTURE)
SUT VICRYL 9 0 (SUTURE) IMPLANT
SUTURE EHLN 10-0 CS-B-6CS-B-6 (SUTURE) IMPLANT
SYR TB 1ML LUER SLIP (SYRINGE) ×3 IMPLANT
WATER STERILE IRR 250ML POUR (IV SOLUTION) ×3 IMPLANT
WICK EYE OCUCEL (MISCELLANEOUS) IMPLANT
WIPE NON LINTING 3.25X3.25 (MISCELLANEOUS) ×3 IMPLANT

## 2016-03-18 NOTE — Transfer of Care (Signed)
Immediate Anesthesia Transfer of Care Note  Patient: Alex Branch  Procedure(s) Performed: Procedure(s) with comments: CATARACT EXTRACTION PHACO AND INTRAOCULAR LENS PLACEMENT (IOC) (Left) - Diabetic Special Equipment:  vision blue Left eye  Patient Location: PACU  Anesthesia Type: MAC  Level of Consciousness: awake, alert  and patient cooperative  Airway and Oxygen Therapy: Patient Spontanous Breathing and Patient connected to supplemental oxygen  Post-op Assessment: Post-op Vital signs reviewed, Patient's Cardiovascular Status Stable, Respiratory Function Stable, Patent Airway and No signs of Nausea or vomiting  Post-op Vital Signs: Reviewed and stable  Complications: No apparent anesthesia complications

## 2016-03-18 NOTE — H&P (Signed)
H+P reviewed and is up to date, please see paper chart.  

## 2016-03-18 NOTE — Op Note (Signed)
Date of Surgery: 03/18/2016  PREOPERATIVE DIAGNOSES: Visually significant cortical cataract, left eye.  POSTOPERATIVE DIAGNOSES: Same  PROCEDURES PERFORMED: Cataract extraction with intraocular lens implant, left eye.  SURGEON: Almon Hercules, M.D.  ANESTHESIA: MAC and topical  IMPLANTS: [id number] +20.0 D  Implant Name Type Inv. Item Serial No. Manufacturer Lot No. LRB No. Used  LENS IOL ACRYSOF IQ 20.0 - [id number] Intraocular Lens LENS IOL ACRYSOF IQ 20.0 [id number] [employer]   Left 1    COMPLICATIONS: None.  DESCRIPTION OF PROCEDURE: Therapeutic options were discussed with the patient preoperatively, including a discussion of risks and benefits of surgery. Informed consent was obtained. An IOL-Master and immersion biometry were used to take the lens measurements, and a dilated fundus exam was performed within 6 months of the surgical date.  The patient was premedicated and brought to the operating room and placed on the operating table in the supine position. After adequate anesthesia, the patient was prepped and draped in the usual sterile ophthalmic fashion. A wire lid speculum was inserted and the microscope was positioned. A Superblade was used to create a paracentesis site at the limbus and a small amount of dilute preservative free lidocaine was instilled into the anterior chamber, followed by dispersive viscoelastic. A clear corneal incision was created temporally using a 2.4 mm keratome blade. Capsulorrhexis was then performed. In situ phacoemulsification was performed.  Cortical material was removed with the irrigation-aspiration unit. Dispersive viscoelastic was instilled to open the capsular bag. A posterior chamber intraocular lens with the specifications above was inserted and positioned. Irrigation-aspiration was used to remove all viscoelastic. Vigamox 1cc was instilled into the anterior chamber, and the corneal incision was checked and found to be water tight. The eyelid speculum  was removed.  The operative eye was covered with protective goggles after instilling 1 drop of Combigan. The patient tolerated the procedure well. There were no complications.

## 2016-03-18 NOTE — Anesthesia Postprocedure Evaluation (Signed)
Anesthesia Post Note  Patient: Alex Branch  Procedure(s) Performed: Procedure(s) (LRB): CATARACT EXTRACTION PHACO AND INTRAOCULAR LENS PLACEMENT (IOC) (Left)  Patient location during evaluation: PACU Anesthesia Type: MAC Level of consciousness: awake and alert Pain management: pain level controlled Vital Signs Assessment: post-procedure vital signs reviewed and stable Respiratory status: spontaneous breathing, nonlabored ventilation, respiratory function stable and patient connected to nasal cannula oxygen Cardiovascular status: stable and blood pressure returned to baseline Anesthetic complications: no    Dare Spillman

## 2016-03-18 NOTE — Anesthesia Procedure Notes (Signed)
Procedure Name: MAC Performed by: Mayme Genta Pre-anesthesia Checklist: Patient identified, Emergency Drugs available, Suction available, Timeout performed and Patient being monitored Patient Re-evaluated:Patient Re-evaluated prior to inductionOxygen Delivery Method: Nasal cannula Placement Confirmation: positive ETCO2

## 2016-03-18 NOTE — Anesthesia Preprocedure Evaluation (Signed)
Anesthesia Evaluation  Patient identified by MRN, date of birth, ID band  Reviewed: NPO status   History of Anesthesia Complications (+) DIFFICULT AIRWAY and history of anesthetic complications (Difficult Airway- due to anterior larynx and Difficult Airway- due to limited oral opening)  Airway Mallampati: III  TM Distance: >3 FB Neck ROM: full    Dental no notable dental hx.    Pulmonary sleep apnea and Continuous Positive Airway Pressure Ventilation ,    Pulmonary exam normal        Cardiovascular Exercise Tolerance: Good hypertension, Normal cardiovascular exam  lipids   Neuro/Psych negative neurological ROS  negative psych ROS   GI/Hepatic Neg liver ROS, ibs   Endo/Other  diabetesMorbid obesity (bmi=35;)  Renal/GU negative Renal ROS  negative genitourinary   Musculoskeletal   Abdominal   Peds  Hematology negative hematology ROS (+)   Anesthesia Other Findings   Reproductive/Obstetrics                             Anesthesia Physical Anesthesia Plan  ASA: II  Anesthesia Plan: MAC   Post-op Pain Management:    Induction:   Airway Management Planned:   Additional Equipment:   Intra-op Plan:   Post-operative Plan:   Informed Consent: I have reviewed the patients History and Physical, chart, labs and discussed the procedure including the risks, benefits and alternatives for the proposed anesthesia with the patient or authorized representative who has indicated his/her understanding and acceptance.     Plan Discussed with: CRNA  Anesthesia Plan Comments: (Difficult Airway- due to anterior larynx and Difficult Airway- due to limited oral opening)        Anesthesia Quick Evaluation

## 2016-03-19 ENCOUNTER — Encounter: Payer: Self-pay | Admitting: Ophthalmology

## 2016-04-16 ENCOUNTER — Encounter: Payer: Self-pay | Admitting: *Deleted

## 2016-04-17 NOTE — Discharge Instructions (Signed)
Cataract Surgery, Care After °Refer to this sheet in the next few weeks. These instructions provide you with information about caring for yourself after your procedure. Your health care provider may also give you more specific instructions. Your treatment has been planned according to current medical practices, but problems sometimes occur. Call your health care provider if you have any problems or questions after your procedure. °What can I expect after the procedure? °After the procedure, it is common to have: °· Itching. °· Discomfort. °· Fluid discharge. °· Sensitivity to light and to touch. °· Bruising. °Follow these instructions at home: °Eye Care  °· Check your eye every day for signs of infection. Watch for: °¨ Redness, swelling, or pain. °¨ Fluid, blood, or pus. °¨ Warmth. °¨ Bad smell. °Activity  °· Avoid strenuous activities, such as playing contact sports, for as long as told by your health care provider. °· Do not drive or operate heavy machinery until your health care provider approves. °· Do not bend or lift heavy objects . Bending increases pressure in the eye. You can walk, climb stairs, and do light household chores. °· Ask your health care provider when you can return to work. If you work in a dusty environment, you may be advised to wear protective eyewear for a period of time. °General instructions  °· Take or apply over-the-counter and prescription medicines only as told by your health care provider. This includes eye drops. °· Do not touch or rub your eyes. °· If you were given a protective shield, wear it as told by your health care provider. If you were not given a protective shield, wear sunglasses as told by your health care provider to protect your eyes. °· Keep the area around your eye clean and dry. Avoid swimming or allowing water to hit you directly in the face while showering until told by your health care provider. Keep soap and shampoo out of your eyes. °· Do not put a contact lens  into the affected eye or eyes until your health care provider approves. °· Keep all follow-up visits as told by your health care provider. This is important. °Contact a health care provider if: ° °· You have increased bruising around your eye. °· You have pain that is not helped with medicine. °· You have a fever. °· You have redness, swelling, or pain in your eye. °· You have fluid, blood, or pus coming from your incision. °· Your vision gets worse. °Get help right away if: °· You have sudden vision loss. °This information is not intended to replace advice given to you by your health care provider. Make sure you discuss any questions you have with your health care provider. °Document Released: 09/07/2004 Document Revised: 06/29/2015 Document Reviewed: 12/29/2014 °Elsevier Interactive Patient Education © 2017 [employer] ° ° ° ° °General Anesthesia, Adult, Care After °These instructions provide you with information about caring for yourself after your procedure. Your health care provider may also give you more specific instructions. Your treatment has been planned according to current medical practices, but problems sometimes occur. Call your health care provider if you have any problems or questions after your procedure. °What can I expect after the procedure? °After the procedure, it is common to have: °· Vomiting. °· A sore throat. °· Mental slowness. °It is common to feel: °· Nauseous. °· Cold or shivery. °· Sleepy. °· Tired. °· Sore or achy, even in parts of your body where you did not have surgery. °Follow these instructions at   home: °For at least 24 hours after the procedure:  °· Do not: °¨ Participate in activities where you could fall or become injured. °¨ Drive. °¨ Use heavy machinery. °¨ Drink alcohol. °¨ Take sleeping pills or medicines that cause drowsiness. °¨ Make important decisions or sign legal documents. °¨ Take care of children on your own. °· Rest. °Eating and drinking  °· If you vomit, drink  water, juice, or soup when you can drink without vomiting. °· Drink enough fluid to keep your urine clear or pale yellow. °· Make sure you have little or no nausea before eating solid foods. °· Follow the diet recommended by your health care provider. °General instructions  °· Have a responsible adult stay with you until you are awake and alert. °· Return to your normal activities as told by your health care provider. Ask your health care provider what activities are safe for you. °· Take over-the-counter and prescription medicines only as told by your health care provider. °· If you smoke, do not smoke without supervision. °· Keep all follow-up visits as told by your health care provider. This is important. °Contact a health care provider if: °· You continue to have nausea or vomiting at home, and medicines are not helpful. °· You cannot drink fluids or start eating again. °· You cannot urinate after 8-12 hours. °· You develop a skin rash. °· You have fever. °· You have increasing redness at the site of your procedure. °Get help right away if: °· You have difficulty breathing. °· You have chest pain. °· You have unexpected bleeding. °· You feel that you are having a life-threatening or urgent problem. °This information is not intended to replace advice given to you by your health care provider. Make sure you discuss any questions you have with your health care provider. °Document Released: 05/27/2000 Document Revised: 07/24/2015 Document Reviewed: 02/02/2015 °Elsevier Interactive Patient Education © 2017 [employer] ° °

## 2016-04-22 ENCOUNTER — Ambulatory Visit: Payer: Medicare Other | Admitting: Student in an Organized Health Care Education/Training Program

## 2016-04-22 ENCOUNTER — Ambulatory Visit
Admission: RE | Admit: 2016-04-22 | Discharge: 2016-04-22 | Disposition: A | Discharge status: Home / Self Care | Payer: Medicare Other | Source: Ambulatory Visit | Attending: Ophthalmology | Admitting: Ophthalmology

## 2016-04-22 ENCOUNTER — Encounter
Admission: RE | Disposition: A | Discharge status: Home / Self Care | Payer: Self-pay | Source: Ambulatory Visit | Attending: Ophthalmology

## 2016-04-22 DIAGNOSIS — H2511 Age-related nuclear cataract, right eye: Secondary | ICD-10-CM | POA: Diagnosis not present

## 2016-04-22 DIAGNOSIS — I10 Essential (primary) hypertension: Secondary | ICD-10-CM | POA: Insufficient documentation

## 2016-04-22 DIAGNOSIS — E669 Obesity, unspecified: Secondary | ICD-10-CM | POA: Diagnosis not present

## 2016-04-22 DIAGNOSIS — G4730 Sleep apnea, unspecified: Secondary | ICD-10-CM | POA: Diagnosis not present

## 2016-04-22 DIAGNOSIS — Z6835 Body mass index (BMI) 35.0-35.9, adult: Secondary | ICD-10-CM | POA: Insufficient documentation

## 2016-04-22 HISTORY — PX: CATARACT EXTRACTION W/PHACO: SHX586

## 2016-04-22 LAB — GLUCOSE, CAPILLARY
GLUCOSE-CAPILLARY: 169 mg/dL — AB (ref 65–99)
Glucose-Capillary: 164 mg/dL — ABNORMAL HIGH (ref 65–99)

## 2016-04-22 SURGERY — PHACOEMULSIFICATION, CATARACT, WITH IOL INSERTION
Anesthesia: Monitor Anesthesia Care | Site: Eye | Laterality: Right | Wound class: Clean

## 2016-04-22 MED ORDER — LIDOCAINE HCL (PF) 2 % IJ SOLN
INTRAMUSCULAR | Status: DC | PRN
  Administered 2016-04-22: .2 mL

## 2016-04-22 MED ORDER — MOXIFLOXACIN HCL 0.5 % OP SOLN
OPHTHALMIC | Status: DC | PRN
  Administered 2016-04-22: .3 mL via OPHTHALMIC

## 2016-04-22 MED ORDER — BRIMONIDINE TARTRATE-TIMOLOL 0.2-0.5 % OP SOLN
OPHTHALMIC | Status: DC | PRN
  Administered 2016-04-22: 1 [drp] via OPHTHALMIC

## 2016-04-22 MED ORDER — MOXIFLOXACIN HCL 0.5 % OP SOLN
1.0000 [drp] | OPHTHALMIC | Status: DC | PRN
  Administered 2016-04-22 (×3): 1 [drp] via OPHTHALMIC

## 2016-04-22 MED ORDER — ARMC OPHTHALMIC DILATING DROPS
1.0000 "application " | OPHTHALMIC | Status: DC | PRN
  Administered 2016-04-22 (×3): 1 via OPHTHALMIC

## 2016-04-22 MED ORDER — MIDAZOLAM HCL 2 MG/2ML IJ SOLN
INTRAMUSCULAR | Status: DC | PRN
  Administered 2016-04-22: 2 mg via INTRAVENOUS

## 2016-04-22 MED ORDER — ACETAMINOPHEN 160 MG/5ML PO SOLN: 325.0000 mg | ORAL | Status: DC | PRN

## 2016-04-22 MED ORDER — ACETAMINOPHEN 325 MG PO TABS: 325.0000 mg | ORAL_TABLET | ORAL | Status: DC | PRN

## 2016-04-22 MED ORDER — FENTANYL CITRATE (PF) 100 MCG/2ML IJ SOLN
INTRAMUSCULAR | Status: DC | PRN
Start: 2016-04-22 — End: 2016-04-22
  Administered 2016-04-22: 100 ug via INTRAVENOUS

## 2016-04-22 MED ORDER — LACTATED RINGERS IV SOLN: INTRAVENOUS | Status: DC

## 2016-04-22 MED ORDER — EPINEPHRINE PF 1 MG/ML IJ SOLN
INTRAOCULAR | Status: DC | PRN
  Administered 2016-04-22: 96 mL via OPHTHALMIC

## 2016-04-22 MED ORDER — NA HYALUR & NA CHOND-NA HYALUR 0.4-0.35 ML IO KIT
PACK | INTRAOCULAR | Status: DC | PRN
  Administered 2016-04-22: 1 mL via INTRAOCULAR

## 2016-04-22 SURGICAL SUPPLY — 22 items
APPLICATOR COTTON TIP 3IN (MISCELLANEOUS) ×3 IMPLANT
CANNULA ANT/CHMB 27GA (MISCELLANEOUS) ×3 IMPLANT
DISSECTOR HYDRO NUCLEUS 50X22 (MISCELLANEOUS) ×3 IMPLANT
GLOVE BIO SURGEON STRL SZ7 (GLOVE) ×3 IMPLANT
GLOVE SURG LX 6.5 MICRO (GLOVE) ×2
GLOVE SURG LX STRL 6.5 MICRO (GLOVE) ×1 IMPLANT
GOWN STRL REUS W/ TWL LRG LVL3 (GOWN DISPOSABLE) ×2 IMPLANT
GOWN STRL REUS W/TWL LRG LVL3 (GOWN DISPOSABLE) ×4
LENS IOL ACRYSOF IQ 20.5 (Intraocular Lens) ×3 IMPLANT
MARKER SKIN DUAL TIP RULER LAB (MISCELLANEOUS) ×3 IMPLANT
PACK CATARACT BRASINGTON (MISCELLANEOUS) ×3 IMPLANT
PACK EYE AFTER SURG (MISCELLANEOUS) ×3 IMPLANT
PACK OPTHALMIC (MISCELLANEOUS) ×3 IMPLANT
RING MALYGIN 7.0 (MISCELLANEOUS) IMPLANT
SUT ETHILON 10-0 CS-B-6CS-B-6 (SUTURE)
SUT VICRYL  9 0 (SUTURE)
SUT VICRYL 9 0 (SUTURE) IMPLANT
SUTURE EHLN 10-0 CS-B-6CS-B-6 (SUTURE) IMPLANT
SYR TB 1ML LUER SLIP (SYRINGE) ×3 IMPLANT
WATER STERILE IRR 250ML POUR (IV SOLUTION) ×3 IMPLANT
WICK EYE OCUCEL (MISCELLANEOUS) IMPLANT
WIPE NON LINTING 3.25X3.25 (MISCELLANEOUS) ×3 IMPLANT

## 2016-04-22 NOTE — Anesthesia Procedure Notes (Signed)
Procedure Name: MAC Performed by: Mayme Genta Pre-anesthesia Checklist: Patient identified, Emergency Drugs available, Suction available, Timeout performed and Patient being monitored Patient Re-evaluated:Patient Re-evaluated prior to inductionOxygen Delivery Method: Nasal cannula Placement Confirmation: positive ETCO2

## 2016-04-22 NOTE — Anesthesia Preprocedure Evaluation (Signed)
Anesthesia Evaluation  Patient identified by MRN, date of birth, ID band  Reviewed: Allergy & Precautions, H&P , NPO status , Patient's Chart, lab work & pertinent test results  History of Anesthesia Complications (+) DIFFICULT AIRWAY and history of anesthetic complications (Difficult Airway- due to anterior larynx and Difficult Airway- due to limited oral opening)  Airway Mallampati: III  TM Distance: >3 FB Neck ROM: full    Dental no notable dental hx.    Pulmonary sleep apnea and Continuous Positive Airway Pressure Ventilation ,    Pulmonary exam normal        Cardiovascular Exercise Tolerance: Good hypertension, Normal cardiovascular exam  lipids   Neuro/Psych negative neurological ROS  negative psych ROS   GI/Hepatic Neg liver ROS, ibs   Endo/Other  diabetesMorbid obesity (bmi=35;)  Renal/GU negative Renal ROS  negative genitourinary   Musculoskeletal   Abdominal   Peds  Hematology negative hematology ROS (+)   Anesthesia Other Findings   Reproductive/Obstetrics                             Anesthesia Physical  Anesthesia Plan  ASA: II  Anesthesia Plan: MAC   Post-op Pain Management:    Induction:   Airway Management Planned:   Additional Equipment:   Intra-op Plan:   Post-operative Plan:   Informed Consent: I have reviewed the patients History and Physical, chart, labs and discussed the procedure including the risks, benefits and alternatives for the proposed anesthesia with the patient or authorized representative who has indicated his/her understanding and acceptance.     Plan Discussed with: CRNA  Anesthesia Plan Comments: (Difficult Airway- due to anterior larynx and Difficult Airway- due to limited oral opening)        Anesthesia Quick Evaluation

## 2016-04-22 NOTE — Transfer of Care (Signed)
Immediate Anesthesia Transfer of Care Note  Patient: Alex Branch  Procedure(s) Performed: Procedure(s) with comments: CATARACT EXTRACTION PHACO AND INTRAOCULAR LENS PLACEMENT (IOC)  right (Right) - Diabetic - insulin and oral meds  Patient Location: PACU  Anesthesia Type: MAC  Level of Consciousness: awake, alert  and patient cooperative  Airway and Oxygen Therapy: Patient Spontanous Breathing and Patient connected to supplemental oxygen  Post-op Assessment: Post-op Vital signs reviewed, Patient's Cardiovascular Status Stable, Respiratory Function Stable, Patent Airway and No signs of Nausea or vomiting  Post-op Vital Signs: Reviewed and stable  Complications: No apparent anesthesia complications

## 2016-04-22 NOTE — Op Note (Signed)
Date of Surgery: 04/22/2016  PREOPERATIVE DIAGNOSES: Visually significant nuclear sclerotic cataract, right eye.  POSTOPERATIVE DIAGNOSES: Same  PROCEDURES PERFORMED: Cataract extraction with intraocular lens implant, right eye.  SURGEON: Almon Hercules, M.D.  ANESTHESIA: MAC and topical  IMPLANTS: [id number] +20.5 D  Implant Name Type Inv. Item Serial No. Manufacturer Lot No. LRB No. Used  LENS IOL ACRYSOF IQ 20.5 - [id number] Intraocular Lens LENS IOL ACRYSOF IQ 20.5 [id number] [employer]   Right 1     COMPLICATIONS: None.  DESCRIPTION OF PROCEDURE: Therapeutic options were discussed with the patient preoperatively, including a discussion of risks and benefits of surgery. Informed consent was obtained. An IOL-Master and immersion biometry were used to take the lens measurements, and a dilated fundus exam was performed within 6 months of the surgical date.  The patient was premedicated and brought to the operating room and placed on the operating table in the supine position. After adequate anesthesia, the patient was prepped and draped in the usual sterile ophthalmic fashion. A wire lid speculum was inserted and the microscope was positioned. A Superblade was used to create a paracentesis site at the limbus and a small amount of dilute preservative free lidocaine was instilled into the anterior chamber, followed by dispersive viscoelastic. A clear corneal incision was created temporally using a 2.4 mm keratome blade. Capsulorrhexis was then performed. In situ phacoemulsification was performed.  Cortical material was removed with the irrigation-aspiration unit. Dispersive viscoelastic was instilled to open the capsular bag. A posterior chamber intraocular lens with the specifications above was inserted and positioned. Irrigation-aspiration was used to remove all viscoelastic. Vigamox 1cc was instilled into the anterior chamber, and the corneal incision was checked and found to be water tight. The  eyelid speculum was removed.  The operative eye was covered with protective goggles after instilling 1 drop of timolol and brimonidine. The patient tolerated the procedure well. There were no complications.

## 2016-04-22 NOTE — Anesthesia Postprocedure Evaluation (Signed)
Anesthesia Post Note  Patient: Alex Branch  Procedure(s) Performed: Procedure(s) (LRB): CATARACT EXTRACTION PHACO AND INTRAOCULAR LENS PLACEMENT (IOC)  right (Right)  Patient location during evaluation: PACU Anesthesia Type: MAC Level of consciousness: awake and alert and oriented Pain management: satisfactory to patient Vital Signs Assessment: post-procedure vital signs reviewed and stable Respiratory status: spontaneous breathing, nonlabored ventilation and respiratory function stable Cardiovascular status: blood pressure returned to baseline and stable Postop Assessment: Adequate PO intake and No signs of nausea or vomiting Anesthetic complications: no    Raliegh Ip

## 2016-04-22 NOTE — H&P (Signed)
H+P reviewed and is up to date, please see paper chart.  

## 2016-04-23 ENCOUNTER — Encounter: Payer: Self-pay | Admitting: Ophthalmology

## 2016-06-03 IMAGING — CT CT ABD-PELV W/ CM
2 of 5 series · 16 of 46 positions shown, 18 images · IV contrast (omnipaque)
Comparison: None ; correlation limited abdominal ultrasound
04/14/2012

CLINICAL DATA: RIGHT upper quadrant mid abdominal pain for several
months, worsening, patient states "his gallbladder is acting up
causing pain". History hypertension, hyperlipidemia, irritable bowel
syndrome, diabetes mellitus

EXAM:
CT ABDOMEN AND PELVIS WITH CONTRAST
TECHNIQUE: Multidetector CT imaging of the abdomen and pelvis was performed
using the standard protocol following bolus administration of
intravenous contrast. Sagittal and coronal MPR images reconstructed
from axial data set.
CONTRAST:  100mL OMNIPAQUE IOHEXOL 300 MG/ML SOLN IV. Dilute oral
contrast.

[Series 2: routine abd pel with · axial · 0.93mm/px · z∈[-217,+218]mm · 13 of 99 slices shown, 15 images]
[im 6/99  soft-tissue]
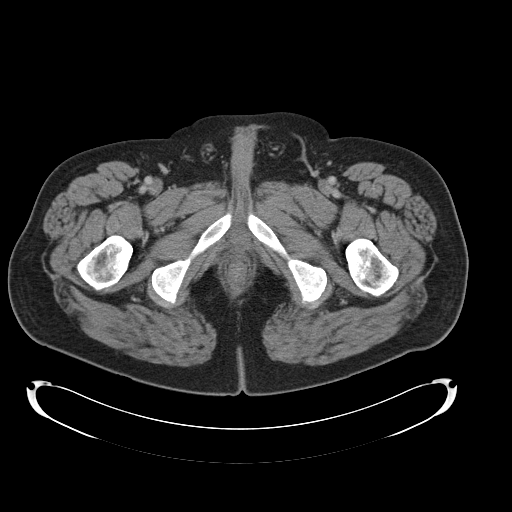
[im 6/99  bone]
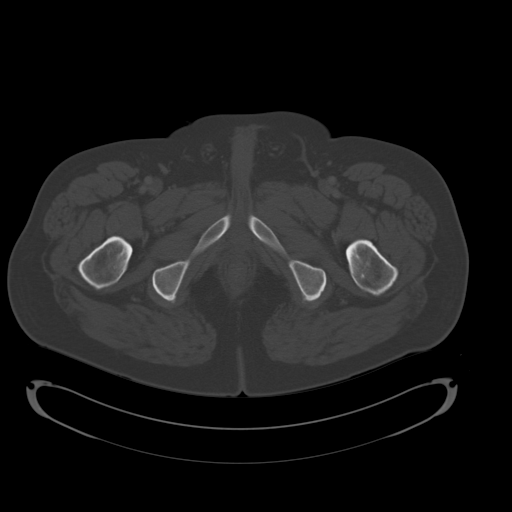
[im 12/99  soft-tissue]
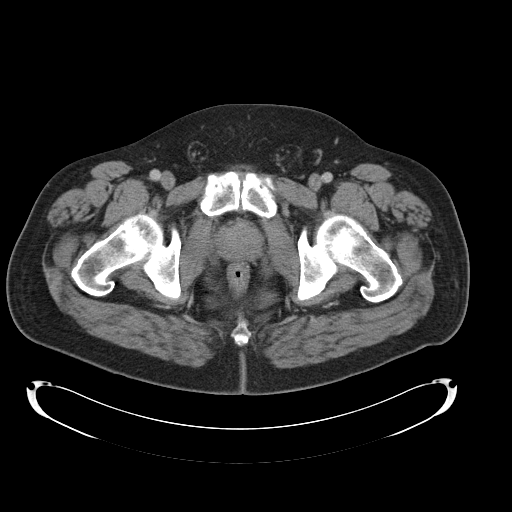
[im 24/99  soft-tissue]
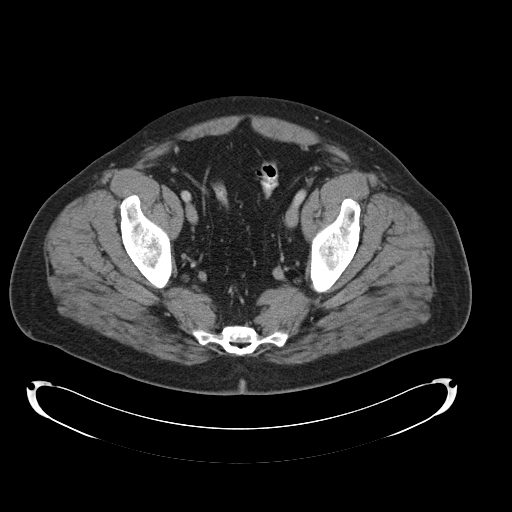
[im 29/99  soft-tissue]
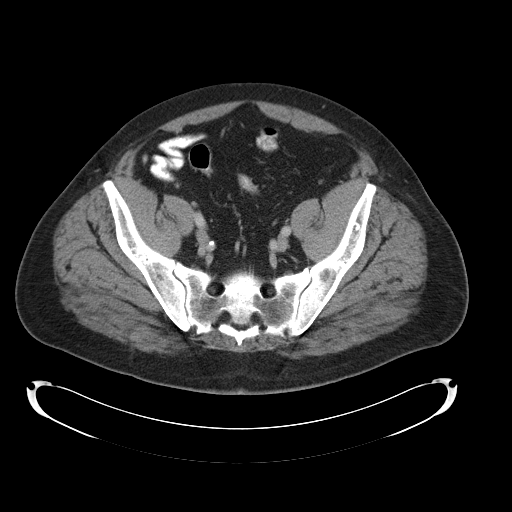
[im 35/99  soft-tissue]
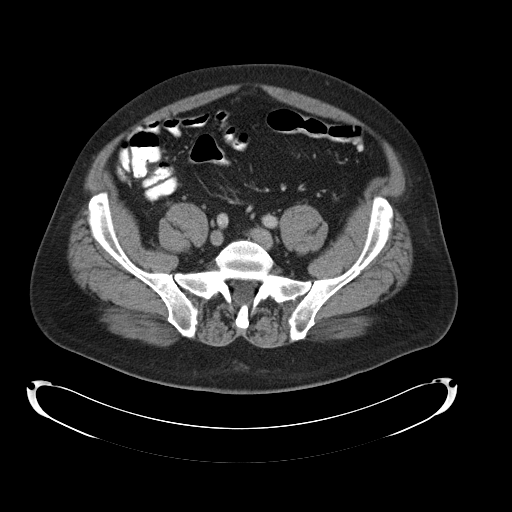
[im 41/99  soft-tissue]
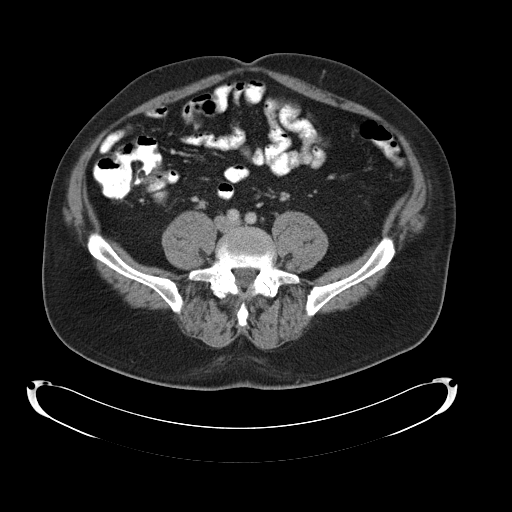
[im 52/99  soft-tissue]
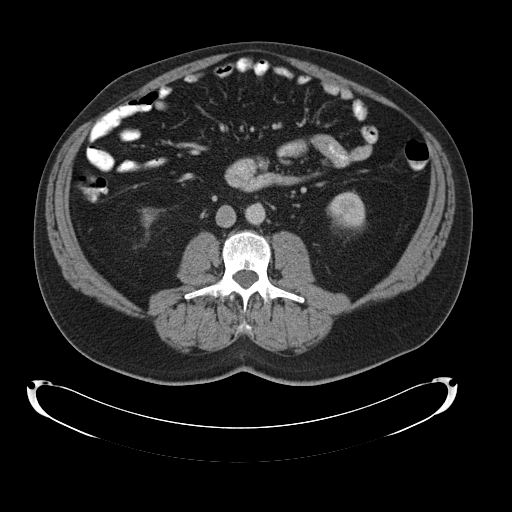
[im 58/99  soft-tissue]
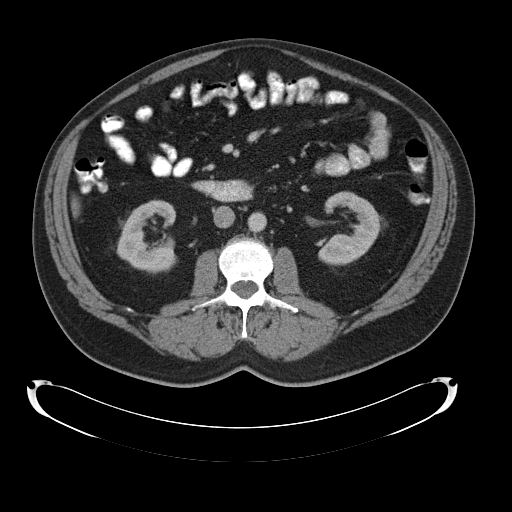
[im 64/99  soft-tissue]
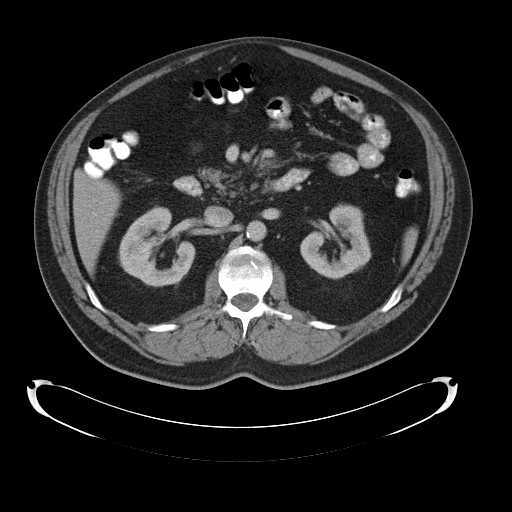
[im 64/99  bone]
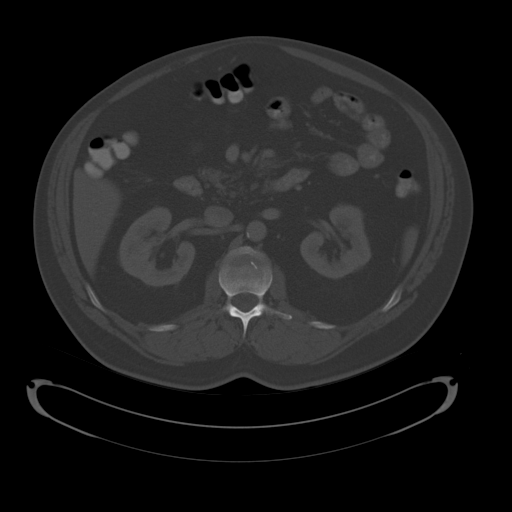
[im 70/99  soft-tissue]
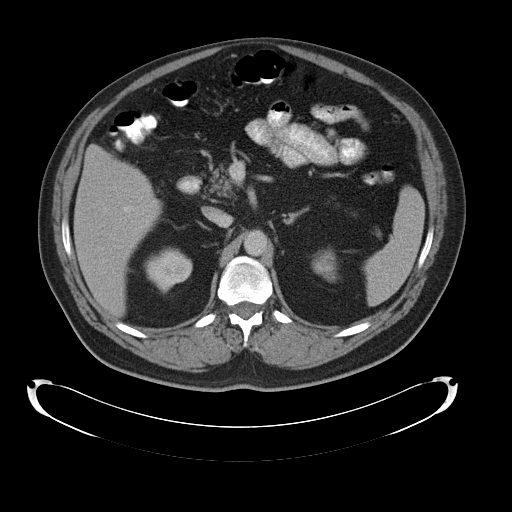
[im 75/99  soft-tissue]
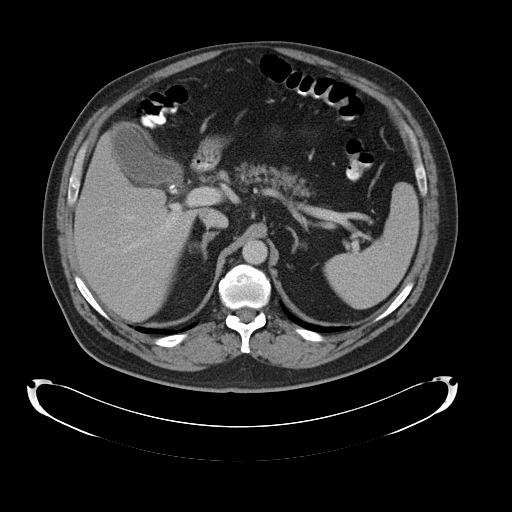
[im 87/99  soft-tissue]
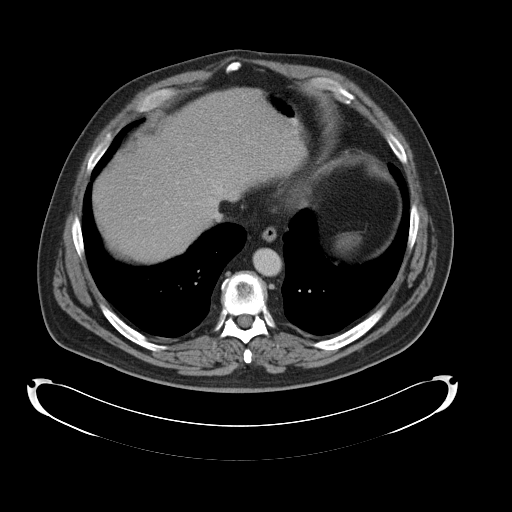
[im 93/99  soft-tissue]
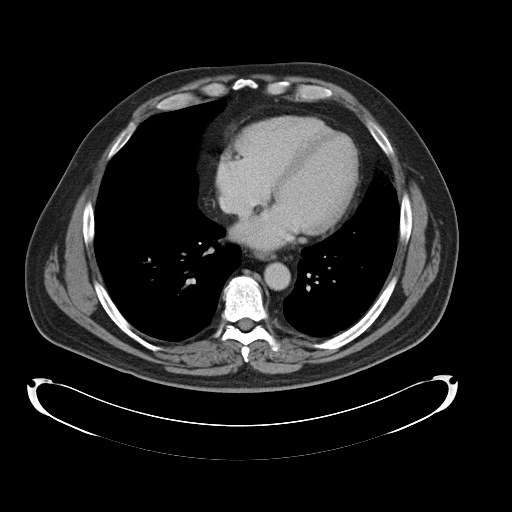

[Series 5: cor routine abd pel with · coronal · 0.84mm/px · 3 of 198 slices shown]
[im 66/198  soft-tissue]
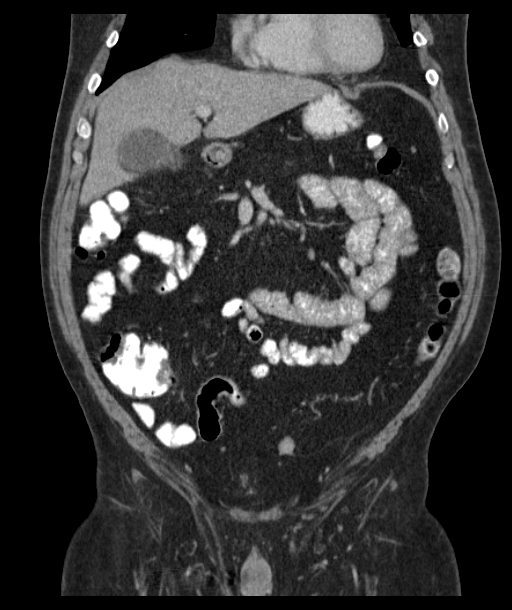
[im 88/198  soft-tissue]
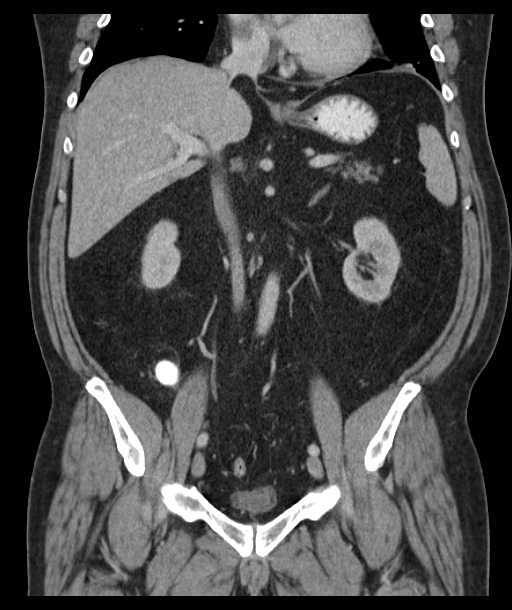
[im 110/198  soft-tissue]
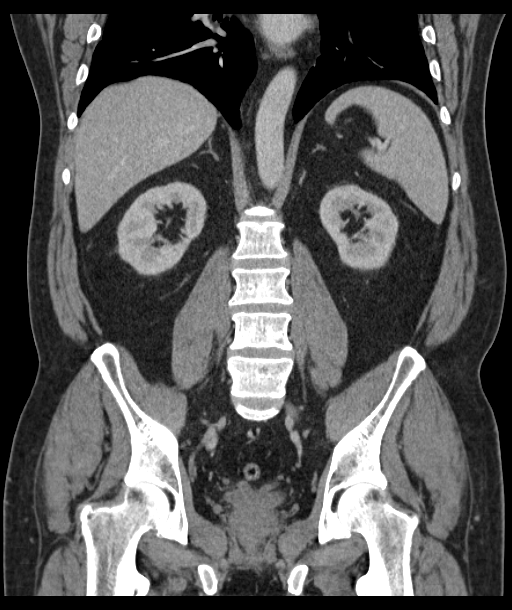

[16 of 46 positions shown; findings below may reference images not displayed]

FINDINGS: Lung bases clear.

Calcified gallstones dependently in gallbladder with mild
gallbladder wall thickening.

Minimal pericholecystic inflammatory changes.

No biliary dilatation.

Findings concerning for acute cholecystitis.

Liver, spleen, pancreas, kidneys, and adrenal glands normal.

Normal appearing ureters and decompressed bladder.

Minimal distal colonic diverticulosis without evidence of
diverticulitis.

Stomach and bowel loops otherwise normal appearance.

No mass, adenopathy, free air or free fluid.

Appendix not visualized.

No acute osseous findings.
IMPRESSION: Cholelithiasis with mild gallbladder wall thickening and minimal
pericholecystic infiltration suspicious for acute cholecystitis.

Minimal distal colonic diverticulosis.

These results will be called to the ordering clinician or
representative by the Radiologist Assistant, and communication
documented in the PACS or zVision Dashboard.

## 2019-04-30 ENCOUNTER — Other Ambulatory Visit: Payer: Self-pay

## 2019-04-30 ENCOUNTER — Ambulatory Visit: Payer: Medicare Other | Attending: Internal Medicine

## 2019-04-30 DIAGNOSIS — Z23 Encounter for immunization: Secondary | ICD-10-CM

## 2019-04-30 NOTE — Progress Notes (Signed)
   Covid-19 Vaccination Clinic  Name:  Alex Branch    MRN: [id number] DOB: 27-Feb-1951  04/30/2019  [name] was observed post Covid-19 immunization for 15 minutes without incidence. He was provided with Vaccine Information Sheet and instruction to access the V-Safe system.   [name] was instructed to call 911 with any severe reactions post vaccine: Marland Kitchen Difficulty breathing  . Swelling of your face and throat  . A fast heartbeat  . A bad rash all over your body  . Dizziness and weakness    Immunizations Administered    Name Date Dose VIS Date Route   Pfizer COVID-19 Vaccine 04/30/2019 10:38 AM 0.3 mL 02/12/2019 Intramuscular   Manufacturer: [employer], [employer]   Lot: [id number]   NDC: [address]-6

## 2019-05-26 ENCOUNTER — Ambulatory Visit: Payer: Medicare Other | Attending: Internal Medicine

## 2019-05-26 DIAGNOSIS — Z23 Encounter for immunization: Secondary | ICD-10-CM

## 2019-05-26 NOTE — Progress Notes (Signed)
   Covid-19 Vaccination Clinic  Name:  Alex Branch    MRN: [id number] DOB: 06/04/1950  05/26/2019  [name] was observed post Covid-19 immunization for 15 minutes without incident. He was provided with Vaccine Information Sheet and instruction to access the V-Safe system.   [name] was instructed to call 911 with any severe reactions post vaccine: Marland Kitchen Difficulty breathing  . Swelling of face and throat  . A fast heartbeat  . A bad rash all over body  . Dizziness and weakness   Immunizations Administered    Name Date Dose VIS Date Route   Pfizer COVID-19 Vaccine 05/26/2019  1:45 PM 0.3 mL 02/12/2019 Intramuscular   Manufacturer: [employer], [employer]   Lot: [id number]   NDC: [address]-0

## 2020-11-22 ENCOUNTER — Ambulatory Visit: Payer: Medicare Other | Admitting: Dermatology

## 2023-03-03 ENCOUNTER — Ambulatory Visit: Payer: Medicare HMO

## 2023-03-03 DIAGNOSIS — K5730 Diverticulosis of large intestine without perforation or abscess without bleeding: Secondary | ICD-10-CM | POA: Diagnosis not present

## 2023-03-03 DIAGNOSIS — Z1211 Encounter for screening for malignant neoplasm of colon: Secondary | ICD-10-CM | POA: Diagnosis present

## 2023-03-03 DIAGNOSIS — D175 Benign lipomatous neoplasm of intra-abdominal organs: Secondary | ICD-10-CM | POA: Diagnosis not present

## 2023-03-03 DIAGNOSIS — Z83719 Family history of colon polyps, unspecified: Secondary | ICD-10-CM | POA: Diagnosis not present
